# Patient Record
Sex: Female | Born: 1975 | Race: White | Hispanic: No | Marital: Single | Smoking: Current every day smoker
Health system: Southern US, Community
[De-identification: ages and names within clinical notes are randomized; demographics above are authoritative.]

## PROBLEM LIST (undated history)

## (undated) DIAGNOSIS — F419 Anxiety disorder, unspecified: Secondary | ICD-10-CM

## (undated) DIAGNOSIS — G5603 Carpal tunnel syndrome, bilateral upper limbs: Secondary | ICD-10-CM

## (undated) DIAGNOSIS — F439 Reaction to severe stress, unspecified: Secondary | ICD-10-CM

## (undated) DIAGNOSIS — F329 Major depressive disorder, single episode, unspecified: Secondary | ICD-10-CM

## (undated) DIAGNOSIS — F32A Depression, unspecified: Secondary | ICD-10-CM

## (undated) HISTORY — PX: FOOT SURGERY: SHX648

## (undated) HISTORY — PX: MOUTH SURGERY: SHX715

## (undated) HISTORY — PX: TUBAL LIGATION: SHX77

---
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
## 1998-02-21 ENCOUNTER — Inpatient Hospital Stay (HOSPITAL_COMMUNITY): Admission: AD | Admit: 1998-02-21 | Discharge: 1998-02-21 | Payer: Self-pay | Admitting: Obstetrics

## 1999-05-23 ENCOUNTER — Emergency Department (HOSPITAL_COMMUNITY): Admission: EM | Admit: 1999-05-23 | Discharge: 1999-05-23 | Payer: Self-pay | Admitting: Emergency Medicine

## 2001-11-20 ENCOUNTER — Emergency Department (HOSPITAL_COMMUNITY): Admission: EM | Admit: 2001-11-20 | Discharge: 2001-11-20 | Payer: Self-pay | Admitting: Emergency Medicine

## 2011-07-26 ENCOUNTER — Emergency Department (HOSPITAL_COMMUNITY)
Admission: EM | Admit: 2011-07-26 | Discharge: 2011-07-27 | Disposition: A | Discharge status: Home / Self Care | Payer: Self-pay | Attending: Emergency Medicine | Admitting: Emergency Medicine

## 2011-07-26 ENCOUNTER — Encounter: Payer: Self-pay | Admitting: *Deleted

## 2011-07-26 DIAGNOSIS — H9210 Otorrhea, unspecified ear: Secondary | ICD-10-CM | POA: Insufficient documentation

## 2011-07-26 DIAGNOSIS — J3489 Other specified disorders of nose and nasal sinuses: Secondary | ICD-10-CM | POA: Insufficient documentation

## 2011-07-26 DIAGNOSIS — H60399 Other infective otitis externa, unspecified ear: Secondary | ICD-10-CM | POA: Insufficient documentation

## 2011-07-26 DIAGNOSIS — F17200 Nicotine dependence, unspecified, uncomplicated: Secondary | ICD-10-CM | POA: Insufficient documentation

## 2011-07-26 DIAGNOSIS — M25519 Pain in unspecified shoulder: Secondary | ICD-10-CM | POA: Insufficient documentation

## 2011-07-26 DIAGNOSIS — R51 Headache: Secondary | ICD-10-CM | POA: Insufficient documentation

## 2011-07-26 DIAGNOSIS — H6092 Unspecified otitis externa, left ear: Secondary | ICD-10-CM

## 2011-07-26 DIAGNOSIS — J019 Acute sinusitis, unspecified: Secondary | ICD-10-CM | POA: Insufficient documentation

## 2011-07-26 DIAGNOSIS — R220 Localized swelling, mass and lump, head: Secondary | ICD-10-CM | POA: Insufficient documentation

## 2011-07-26 DIAGNOSIS — H9209 Otalgia, unspecified ear: Secondary | ICD-10-CM | POA: Insufficient documentation

## 2011-07-26 NOTE — ED Notes (Signed)
Patient c/o right shoulder pain and ibuprofen is not working.  Patient also thinks that her right ear drum has been blown due to drainage.

## 2011-07-27 MED ORDER — HYDROCODONE-ACETAMINOPHEN 7.5-325 MG/15ML PO SOLN: 15.0000 mL | ORAL | Status: AC | PRN

## 2011-07-27 MED ORDER — AMOXICILLIN 500 MG PO CAPS: 500.0000 mg | ORAL_CAPSULE | Freq: Three times a day (TID) | ORAL | Status: AC

## 2011-07-27 MED ORDER — CIPROFLOXACIN-DEXAMETHASONE 0.3-0.1 % OT SUSP: 4.0000 [drp] | Freq: Two times a day (BID) | OTIC | Status: AC

## 2011-07-27 MED ORDER — PREDNISONE 20 MG PO TABS: 40.0000 mg | ORAL_TABLET | Freq: Every day | ORAL | Status: DC

## 2011-07-27 MED ORDER — CIPROFLOXACIN-DEXAMETHASONE 0.3-0.1 % OT SUSP: 4.0000 [drp] | Freq: Two times a day (BID) | OTIC | Status: DC

## 2011-07-27 MED ORDER — AMOXICILLIN 500 MG PO CAPS: 500.0000 mg | ORAL_CAPSULE | Freq: Three times a day (TID) | ORAL | Status: DC

## 2011-07-27 MED ORDER — PREDNISONE 20 MG PO TABS: 40.0000 mg | ORAL_TABLET | Freq: Every day | ORAL | Status: AC

## 2011-07-27 MED ORDER — PREDNISONE 20 MG PO TABS
60.0000 mg | ORAL_TABLET | Freq: Once | ORAL | Status: AC
  Administered 2011-07-27: 60 mg via ORAL

## 2011-07-27 NOTE — ED Notes (Signed)
I gave the patient a cup of coffee and two packs of chocolate chip cookies.

## 2011-07-27 NOTE — ED Provider Notes (Signed)
Medical screening examination/treatment/procedure(s) were performed by non-physician practitioner and as supervising physician I was immediately available for consultation/collaboration.   Vida Roller, MD 07/27/11 [phone]

## 2011-07-27 NOTE — ED Provider Notes (Signed)
History     CSN: [id number] Arrival date & time: 07/26/2011 10:16 PM   First MD Initiated Contact with Patient 07/27/11 0154      Chief Complaint  Patient presents with  . Shoulder Pain     Patient is a 35 y.o. female presenting with shoulder pain. The history is provided by the patient. No language interpreter was used.  Shoulder Pain This is a recurrent problem. The current episode started more than 1 month ago. The problem occurs intermittently. The problem has been unchanged. Associated symptoms include congestion. Pertinent negatives include no fever. She has tried NSAIDs for the symptoms. The treatment provided mild relief.  Reports persistent, intermittent (R) shoulder pain x 2 mo after moving a heavy piece of furniture. Pt also c/o severe sinus congestion, frontal area h/a's and (L) ear pain x 2 weeks.    History reviewed. No pertinent past medical history.  History reviewed. No pertinent past surgical history.  No family history on file.  History  Substance Use Topics  . Smoking status: Current Everyday Smoker -- 1.0 packs/day    Types: Cigarettes  . Smokeless tobacco: Not on file  . Alcohol Use: No    OB History    Grav Para Term Preterm Abortions TAB SAB Ect Mult Living                  Review of Systems  Constitutional: Negative.  Negative for fever.  HENT: Positive for congestion and ear discharge.   Eyes: Negative.   Respiratory: Negative.   Cardiovascular: Negative.   Gastrointestinal: Negative.   Genitourinary: Negative.   Musculoskeletal:       Pain w/ ROM of (R) shoulder.  Skin: Negative.   Neurological: Negative.   Hematological: Negative.   Psychiatric/Behavioral: Negative.     Allergies  Penicillins and Tobradex st  Home Medications   Current Outpatient Rx  Name Route Sig Dispense Refill  . GUAIFENESIN 600 MG PO TB12 Oral Take 1,200 mg by mouth 2 (two) times daily. For congestion     . IBUPROFEN 600 MG PO TABS Oral Take 600 mg by  mouth every 6 (six) hours as needed. pain       BP 101/68  Pulse 72  Temp(Src) 98.6 F (37 C) (Oral)  Resp 16  SpO2 97%  Physical Exam  Constitutional: She is oriented to person, place, and time. She appears well-developed and well-nourished.  HENT:  Head: Normocephalic and atraumatic.  Right Ear: External ear and ear canal normal.  Left Ear: There is drainage. Tympanic membrane is erythematous.  Nose: Mucosal edema present. No nasal deformity.  Mouth/Throat: Uvula is midline, oropharynx is clear and moist and mucous membranes are normal.       (R) TM w/ scarring from hx chronic ear infections as child. (L) external ear canal erythematous w/ pus like exudate.  Eyes: Conjunctivae are normal.  Neck: Normal range of motion. Neck supple.  Cardiovascular: Normal rate.   Pulmonary/Chest: Effort normal and breath sounds normal.  Musculoskeletal:       Right shoulder: She exhibits pain. She exhibits normal range of motion, no swelling, no effusion and no deformity.  Neurological: She is alert and oriented to person, place, and time. She has normal reflexes.  Skin: Skin is warm and dry. No rash noted.  Psychiatric: She has a normal mood and affect.    ED Course  Procedures (including critical care time)  Labs Reviewed - No data to display No results found.  No diagnosis found.    MDM  PE findings c/w sinusitis and (L) otitis externa Full rom of (R) shoulder. No boney point TTP. Will rest w/ sling, provide sghort course of med for pain and encourage f/u w/ ortho if no improvement PCP referrals provided as well.        Leanne Chang, NP 07/27/11 [phone]

## 2011-10-01 ENCOUNTER — Emergency Department (HOSPITAL_COMMUNITY)
Admission: EM | Admit: 2011-10-01 | Discharge: 2011-10-01 | Disposition: A | Discharge status: Home / Self Care | Payer: Self-pay | Attending: Emergency Medicine | Admitting: Emergency Medicine

## 2011-10-01 ENCOUNTER — Encounter (HOSPITAL_COMMUNITY): Payer: Self-pay | Admitting: *Deleted

## 2011-10-01 DIAGNOSIS — H6120 Impacted cerumen, unspecified ear: Secondary | ICD-10-CM | POA: Insufficient documentation

## 2011-10-01 DIAGNOSIS — H6690 Otitis media, unspecified, unspecified ear: Secondary | ICD-10-CM | POA: Insufficient documentation

## 2011-10-01 DIAGNOSIS — F17200 Nicotine dependence, unspecified, uncomplicated: Secondary | ICD-10-CM | POA: Insufficient documentation

## 2011-10-01 DIAGNOSIS — H9209 Otalgia, unspecified ear: Secondary | ICD-10-CM | POA: Insufficient documentation

## 2011-10-01 DIAGNOSIS — H6692 Otitis media, unspecified, left ear: Secondary | ICD-10-CM

## 2011-10-01 MED ORDER — ANTIPYRINE-BENZOCAINE 5.4-1.4 % OT SOLN
3.0000 [drp] | Freq: Once | OTIC | Status: AC
  Administered 2011-10-01: 4 [drp] via OTIC
  Filled 2011-10-01: qty 10

## 2011-10-01 MED ORDER — PROMETHAZINE HCL 25 MG PO TABS: 25.0000 mg | ORAL_TABLET | Freq: Four times a day (QID) | ORAL | Status: AC | PRN

## 2011-10-01 MED ORDER — AMOXICILLIN 500 MG PO CAPS: 500.0000 mg | ORAL_CAPSULE | Freq: Three times a day (TID) | ORAL | Status: AC

## 2011-10-01 MED ORDER — ACETAMINOPHEN 500 MG PO TABS
1000.0000 mg | ORAL_TABLET | Freq: Once | ORAL | Status: AC
Start: 2011-10-01 — End: 2011-10-01
  Administered 2011-10-01: 1000 mg via ORAL
  Filled 2011-10-01: qty 2

## 2011-10-01 NOTE — ED Notes (Signed)
Pt alert, nad, c/o ear aches, onset several months ago, resp even unlabored, skin pwd, right TM not visible, left TM not visible, pt has gross amount of "slough like" tissue in canal. ALP bedside to eval pt

## 2011-10-01 NOTE — ED Notes (Signed)
Irrigated bilateral ears, copious amounts of brown fluid returns, TM visualized, ALP notified

## 2011-10-01 NOTE — ED Notes (Signed)
Pt in c/o bilateral earache and drainage, states this has been going on for months and has been evaluated for same but infections have not healed

## 2011-10-03 NOTE — ED Provider Notes (Signed)
History     CSN: [id number]  Arrival date & time 10/01/11  0049   First MD Initiated Contact with Patient 10/01/11 0101      Chief Complaint  Patient presents with  . Otalgia    (Consider location/radiation/quality/duration/timing/severity/associated sxs/prior treatment) Patient is a 36 y.o. female presenting with ear pain. The history is provided by the patient.  Otalgia This is a recurrent problem. The current episode started more than 2 days ago. There is pain in the left ear. The problem occurs constantly. The problem has been gradually worsening. There has been no fever. The pain is at a severity of 5/10. The pain is moderate. Associated symptoms include hearing loss. Pertinent negatives include no ear discharge, no rhinorrhea, no sore throat and no cough.    History reviewed. No pertinent past medical history.  History reviewed. No pertinent past surgical history.  History reviewed. No pertinent family history.  History  Substance Use Topics  . Smoking status: Current Everyday Smoker -- 1.0 packs/day    Types: Cigarettes  . Smokeless tobacco: Not on file  . Alcohol Use: No    OB History    Grav Para Term Preterm Abortions TAB SAB Ect Mult Living                  Review of Systems  Constitutional: Negative.   HENT: Positive for hearing loss and ear pain. Negative for sore throat, rhinorrhea and ear discharge.   Eyes: Negative.   Respiratory: Negative.  Negative for cough.   Cardiovascular: Negative.   Gastrointestinal: Negative.   Genitourinary: Negative.   Musculoskeletal: Negative.   Skin: Negative.   Neurological: Negative.   Hematological: Negative.   Psychiatric/Behavioral: Negative.   Pt reports several days of increasing (L) ear pain. States she has chronic OM L>R but is unable to afford to see ENT physician. States she has lots of intermittent ear itching and ear wax for which she frequently uses Q-tips to remove.   Allergies  Penicillins;  Shellfish allergy; and Tobradex st  Home Medications   Current Outpatient Rx  Name Route Sig Dispense Refill  . GUAIFENESIN ER 600 MG PO TB12 Oral Take 1,200 mg by mouth 2 (two) times daily. For congestion     . IBUPROFEN 200 MG PO TABS Oral Take 400 mg by mouth every 6 (six) hours as needed. Pain    . AMOXICILLIN 500 MG PO CAPS Oral Take 1 capsule (500 mg total) by mouth 3 (three) times daily. 30 capsule 0  . PROMETHAZINE HCL 25 MG PO TABS Oral Take 1 tablet (25 mg total) by mouth every 6 (six) hours as needed for nausea. 10 tablet 0    BP 132/69  Pulse 93  Temp(Src) 98 F (36.7 C) (Oral)  Resp 20  SpO2 100%  Physical Exam  Constitutional: She is oriented to person, place, and time. She appears well-developed and well-nourished.  HENT:  Head: Normocephalic and atraumatic.  Right Ear: Tympanic membrane normal.  Left Ear: Tympanic membrane is erythematous.       Cerumen impactions bil requiring bil irrigations to visualize TM's. Lots of cerumen and non-infectious appearing debris in bil ear canals. (L) TM erythematous.  Eyes: Conjunctivae are normal.  Neck: Neck supple.  Cardiovascular: Normal rate.   Pulmonary/Chest: Effort normal.  Musculoskeletal: Normal range of motion.  Neurological: She is alert and oriented to person, place, and time.  Skin: Skin is warm and dry. No erythema.  Psychiatric: She has a normal mood and  affect.    ED Course  Procedures Findings and clinical impression discussed w/ pt. Will plan for d/c home w/ tx for (L) OM.  Labs Reviewed - No data to display No results found.   1. Otitis media of left ear       MDM  HPI/PE and clinical findings c/w 1.Bil cerumen impactions 2. (L) OM        Leanne Chang, NP 10/03/11 1258  Medical screening examination/treatment/procedure(s) were performed by non-physician practitioner and as supervising physician I was immediately available for consultation/collaboration.  Sunnie Nielsen,  MD 10/04/11 [phone]

## 2012-02-27 ENCOUNTER — Emergency Department (HOSPITAL_COMMUNITY)
Admission: EM | Admit: 2012-02-27 | Discharge: 2012-02-28 | Disposition: A | Discharge status: Home / Self Care | Payer: Self-pay | Attending: Emergency Medicine | Admitting: Emergency Medicine

## 2012-02-27 ENCOUNTER — Encounter (HOSPITAL_COMMUNITY): Payer: Self-pay

## 2012-02-27 DIAGNOSIS — B369 Superficial mycosis, unspecified: Secondary | ICD-10-CM | POA: Insufficient documentation

## 2012-02-27 DIAGNOSIS — B368 Other specified superficial mycoses: Secondary | ICD-10-CM | POA: Insufficient documentation

## 2012-02-27 DIAGNOSIS — H6240 Otitis externa in other diseases classified elsewhere, unspecified ear: Secondary | ICD-10-CM

## 2012-02-27 DIAGNOSIS — F17200 Nicotine dependence, unspecified, uncomplicated: Secondary | ICD-10-CM | POA: Insufficient documentation

## 2012-02-27 LAB — CBC
Hemoglobin: 14 g/dL (ref 12.0–15.0)
MCH: 30.2 pg (ref 26.0–34.0)
MCHC: 34.7 g/dL (ref 30.0–36.0)
MCV: 87 fL (ref 78.0–100.0)
Platelets: 201 10*3/uL (ref 150–400)

## 2012-02-27 LAB — BASIC METABOLIC PANEL
Calcium: 9.1 mg/dL (ref 8.4–10.5)
Creatinine, Ser: 0.82 mg/dL (ref 0.50–1.10)
GFR calc non Af Amer: >90 mL/min (ref >90)
Glucose, Bld: 92 mg/dL (ref 70–99)
Sodium: 137 mEq/L (ref 135–145)

## 2012-02-27 LAB — GLUCOSE, CAPILLARY: Glucose-Capillary: 90 mg/dL (ref 70–99)

## 2012-02-27 MED ORDER — CLOTRIMAZOLE 1 % EX SOLN: Freq: Two times a day (BID) | CUTANEOUS | Status: AC

## 2012-02-27 NOTE — ED Notes (Signed)
Both ears are bothering pt.  States she can't afford to go the ENT.  Currently they are "clogged up and are itching and are draining.  They dry up and i scratch them"  Pt states this is an ongoing thing.

## 2012-02-27 NOTE — ED Provider Notes (Signed)
History     CSN: [id number]  Arrival date & time 02/27/12  7829   First MD Initiated Contact with Patient 02/27/12 2038      Chief Complaint  Patient presents with  . Ear Drainage  . Otalgia    (Consider location/radiation/quality/duration/timing/severity/associated sxs/prior treatment) HPI Comments: Patient presents emergency department with chief complaint of ear drainage and itching.  Patient states she's been suffering from this since September (6 months).  Patient states she's unable to afford an ENT or pass medications that have been prescribed to her.  She reports decreased hearing bilaterally and some mild otalgia. She states that it is draining and itching that bothers her.  Patient denies being a diabetic, immunocompromised or taking recent antibiotics. Associated s/s include right jaw pain.  Patient is a current everyday smoker.  No other complaints at this time.    Patient is a 36 y.o. female presenting with ear drainage and ear pain. The history is provided by the patient.  Ear Drainage Pertinent negatives include no congestion or neck pain.  Otalgia Associated symptoms include ear discharge and hearing loss. Pertinent negatives include no rhinorrhea and no neck pain.    History reviewed. No pertinent past medical history.  Past Surgical History  Procedure Date  . Foot surgery     RT foot-"2 titanium screws in my arch"  . Cesarean section     x2  . Mouth surgery   . Tubal ligation     History reviewed. No pertinent family history.  History  Substance Use Topics  . Smoking status: Current Everyday Smoker -- 1.0 packs/day    Types: Cigarettes  . Smokeless tobacco: Not on file  . Alcohol Use: No    OB History    Grav Para Term Preterm Abortions TAB SAB Ect Mult Living                  Review of Systems  HENT: Positive for hearing loss and ear discharge. Negative for ear pain, nosebleeds, congestion, facial swelling, rhinorrhea, neck pain, neck stiffness  and tinnitus.   All other systems reviewed and are negative.    Allergies  Shellfish allergy; Mucinex; Penicillins; and Tobramycin-dexamethasone  Home Medications   Current Outpatient Rx  Name Route Sig Dispense Refill  . CETIRIZINE HCL 10 MG PO TABS Oral Take 10 mg by mouth daily.    . IBUPROFEN 200 MG PO TABS Oral Take 400 mg by mouth every 6 (six) hours as needed. Pain and headache    . PSEUDOEPHEDRINE HCL 30 MG PO TABS Oral Take 30 mg by mouth every 4 (four) hours as needed. Congestion    . CLOTRIMAZOLE 1 % EX SOLN Topical Apply topically 2 (two) times daily. 30 mL 3    BP 111/69  Pulse 81  Temp 98.5 F (36.9 C) (Oral)  Resp 18  Ht 5\' 7"  (1.702 m)  Wt 224 lb (101.606 kg)  BMI 35.08 kg/m2  SpO2 100%  LMP 02/27/2012  Physical Exam  Nursing note and vitals reviewed. Constitutional: She is oriented to person, place, and time. She appears well-developed and well-nourished. No distress.  HENT:  Head: Normocephalic and atraumatic.       Decreased hearing bilaterally, unable to visualize tympanic membranes due to white sebaceous-like material in ears bilaterally.  No mastoid tenderness or tragal tenderness to palpation.    Eyes: Conjunctivae and EOM are normal.  Neck: Normal range of motion.  Pulmonary/Chest: Effort normal.  Musculoskeletal: Normal range of motion.  Neurological:  She is alert and oriented to person, place, and time.  Skin: Skin is warm and dry. No rash noted. She is not diaphoretic.  Psychiatric: She has a normal mood and affect. Her behavior is normal.    ED Course  Procedures (including critical care time)   Labs Reviewed  GLUCOSE, CAPILLARY  SEDIMENTATION RATE  CBC  BASIC METABOLIC PANEL   No results found.   1. 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  Treatment per up to date as follows "1% solution, applied twice  daily for 10 to 14 days, and then reassess the ear canal. If fungal elements are identified, the ear canal should again be meticulously cleaned and undergo a further 10 to 14 day course of topical antifungal with reassessment thereafter. Persistent otomycosis should be managed by an otolaryngologist to ensure optimal cleaning of the external canal (usually with microscopic otoscopy). Ear cleaning followed by topical therapy and reassessment in two weeks may be required for several cycles to achieve eradication" Discussed with pt who verbalizes understanding.          Jaci Carrel, [place] 02/28/12 0005

## 2012-02-27 NOTE — Discharge Instructions (Signed)
Use medication as prescribed, applied twice daily for 10 to 14 days, and then follow up to have ear canal reassessed. If fungal elements are identified, the ear canal should again be meticulously cleaned and undergo a further 10 to 14 day course of topical antifungal with reassessment thereafter. Persistent otomycosis should be managed by an otolaryngologist to ensure optimal cleaning of the external canal (usually with microscopic otoscopy). Ear cleaning followed by topical therapy and reassessment in two weeks may be required for several cycles to achieve eradication" Discussed with pt who verbalizes understanding.   Otomycosis Otomycosis is a fungus infection of the ear canal. This may cause an earache, hearing loss, and large amounts of debris to accumulate in the ear canal. Otomycosis is more common in humid climates and is usually caused by candida or aspegillus. External ear infections that do not respond to antibiotic ear drops may be due to a fungus. The most common symptom or complaint is itching or itching deep in the ear canal. Unless your eardrum has a hole in it, anti-fungal medicines used on the skin can be used 2 to 3 times daily in the ear canal after the debris is cleaned out thoroughly. Two weeks of treatment are usually needed, but careful follow-up is recommended. Fungus infections can be difficult to cure. Except for your ear drops, the ear canal must be kept dry until the infection is cured. Do not go swimming or use ear plugs. Protect your ear canal with a cotton ball covered with petroleum jelly while showering. Call your caregiver if you are not better after several days of treatment.  SEEK IMMEDIATE MEDICAL CARE IF: You develop severe pain, increased hearing loss, dizziness, vomiting, a high fever, or other serious symptoms. Document Released: 10/09/2004 Document Revised: 08/21/2011 Document Reviewed: 12/27/2008 [facility] Patient Information 2012 [place], [place].

## 2012-02-28 NOTE — ED Provider Notes (Signed)
Medical screening examination/treatment/procedure(s) were performed by non-physician practitioner and as supervising physician I was immediately available for consultation/collaboration.   Nat Christen, MD 02/28/12 [phone]

## 2013-03-08 ENCOUNTER — Encounter (HOSPITAL_COMMUNITY): Payer: Self-pay | Admitting: Family Medicine

## 2013-03-08 ENCOUNTER — Emergency Department (HOSPITAL_COMMUNITY)
Admission: EM | Admit: 2013-03-08 | Discharge: 2013-03-09 | Disposition: A | Discharge status: Home / Self Care | Payer: BC Managed Care – PPO | Attending: Emergency Medicine | Admitting: Emergency Medicine

## 2013-03-08 DIAGNOSIS — H9210 Otorrhea, unspecified ear: Secondary | ICD-10-CM | POA: Insufficient documentation

## 2013-03-08 DIAGNOSIS — Z79899 Other long term (current) drug therapy: Secondary | ICD-10-CM | POA: Insufficient documentation

## 2013-03-08 DIAGNOSIS — G8929 Other chronic pain: Secondary | ICD-10-CM | POA: Insufficient documentation

## 2013-03-08 DIAGNOSIS — F17200 Nicotine dependence, unspecified, uncomplicated: Secondary | ICD-10-CM | POA: Insufficient documentation

## 2013-03-08 DIAGNOSIS — H6092 Unspecified otitis externa, left ear: Secondary | ICD-10-CM

## 2013-03-08 DIAGNOSIS — H9209 Otalgia, unspecified ear: Secondary | ICD-10-CM | POA: Insufficient documentation

## 2013-03-08 DIAGNOSIS — Z880 Allergy status to penicillin: Secondary | ICD-10-CM | POA: Insufficient documentation

## 2013-03-08 DIAGNOSIS — R51 Headache: Secondary | ICD-10-CM | POA: Insufficient documentation

## 2013-03-08 DIAGNOSIS — H60399 Other infective otitis externa, unspecified ear: Secondary | ICD-10-CM | POA: Insufficient documentation

## 2013-03-08 MED ORDER — KETOROLAC TROMETHAMINE 60 MG/2ML IM SOLN
60.0000 mg | Freq: Once | INTRAMUSCULAR | Status: AC
  Administered 2013-03-09: 60 mg via INTRAMUSCULAR
  Filled 2013-03-08: qty 2

## 2013-03-08 MED ORDER — CIPROFLOXACIN-DEXAMETHASONE 0.3-0.1 % OT SUSP
4.0000 [drp] | Freq: Once | OTIC | Status: AC
  Administered 2013-03-09: 4 [drp] via OTIC
  Filled 2013-03-08: qty 7.5

## 2013-03-08 NOTE — ED Notes (Signed)
Unable to obtain temp due to pt drinking iced coffee

## 2013-03-08 NOTE — ED Notes (Addendum)
Patient states that she has been having headaches off and on since the winter. States headaches are getting worse and is having dizziness. Occasional nausea. Has been taking Aleve with minimal relief. Also c/o left ear pressure and states ear is draining and has a smell to it.

## 2013-03-08 NOTE — ED Notes (Signed)
Pt reports increasing amounts of stressors in her daily life.  Pt reports being the sole caregiver of children.  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

## 2013-03-08 NOTE — ED Provider Notes (Signed)
History    CSN: [id number] Arrival date & time 03/08/13  2229  First MD Initiated Contact with Patient 03/08/13 2334     Chief Complaint  Patient presents with  . Headache   (Consider location/radiation/quality/duration/timing/severity/associated sxs/prior Treatment) HPI Hx per patient - L sided HA everyday for the last 6 months, recently divorced, admits to multiple stressors. Has recently been referred to a neurologist.  She has no weakness, numbness, trouble with speech/ gait.  No N/V. No CP or SOB. No rash, no fever or neck stiffness.  HA dull in quality, not radiating, not worse HA of life.   She also has L ear drainge and discomfort.  She has ear drops at home for pain relief and feels like she needs ABx.   No sore throat, cough or swelling. No recent swimming/ aquatics.  History reviewed. No pertinent past medical history. Past Surgical History  Procedure Laterality Date  . Foot surgery      RT foot-"2 titanium screws in my arch"  . Cesarean section      x2  . Mouth surgery    . Tubal ligation     No family history on file. History  Substance Use Topics  . Smoking status: Current Every Day Smoker -- 0.50 packs/day    Types: Cigarettes  . Smokeless tobacco: Not on file  . Alcohol Use: No   OB History   Grav Para Term Preterm Abortions TAB SAB Ect Mult Living                 Review of Systems  Constitutional: Negative for fever and chills.  HENT: Positive for ear pain and ear discharge. Negative for hearing loss, neck pain and neck stiffness.   Eyes: Negative for redness and visual disturbance.  Respiratory: Negative for shortness of breath.   Cardiovascular: Negative for chest pain.  Gastrointestinal: Negative for abdominal pain.  Genitourinary: Negative for dysuria.  Musculoskeletal: Negative for back pain.  Skin: Negative for rash.  Neurological: Positive for headaches.  All other systems reviewed and are negative.    Allergies  Shellfish allergy;  Mucinex; Penicillins; and Tobramycin-dexamethasone  Home Medications   Current Outpatient Rx  Name  Route  Sig  Dispense  Refill  . cetirizine (ZYRTEC) 10 MG tablet   Oral   Take 10 mg by mouth every evening.          . naproxen sodium (ANAPROX) 220 MG tablet   Oral   Take 220 mg by mouth 2 (two) times daily as needed (pain).          BP 118/71  Pulse 81  Temp(Src) 97.5 F (36.4 C) (Oral)  Resp 18  Ht 5\' 7"  (1.702 m)  Wt 235 lb (106.595 kg)  BMI 36.8 kg/m2  SpO2 97%  LMP 03/03/2013 Physical Exam  Constitutional: She is oriented to person, place, and time. She appears well-developed and well-nourished.  HENT:  Head: Normocephalic and atraumatic.  Nose: Nose normal.  Mouth/Throat: Oropharynx is clear and moist. No oropharyngeal exudate.  TMs clear, L ear canal drainage and mild tenderness ext ear  Eyes: EOM are normal. Pupils are equal, round, and reactive to light. Right eye exhibits no discharge. Left eye exhibits no discharge.  Neck: Normal range of motion. Neck supple.  Cardiovascular: Normal rate, regular rhythm and intact distal pulses.   Pulmonary/Chest: Effort normal and breath sounds normal. No respiratory distress.  Abdominal: Soft. She exhibits no distension. There is no tenderness.  Musculoskeletal: Normal range  of motion. She exhibits no edema.  Neurological: She is alert and oriented to person, place, and time.  Skin: Skin is warm and dry.    ED Course  Procedures (including critical care time)  IM toradol and ABx ear drops provided  NEU referral provided. HA precautions provided.   Plan d/c home treatment for otitis externa and motrin 800mg  for headaches  MDM  Chronic HA and L OE. Normal neuro exam - no indication for imaging or LP at this time.   Medications provided  VS and nursing notes reviewed/ considered  Sunnie Nielsen, MD 03/09/13 0001

## 2013-03-09 MED ORDER — IBUPROFEN 800 MG PO TABS: 800.0000 mg | ORAL_TABLET | Freq: Three times a day (TID) | ORAL | Status: DC

## 2013-03-09 MED ORDER — CIPROFLOXACIN-DEXAMETHASONE 0.3-0.1 % OT SUSP: 4.0000 [drp] | Freq: Two times a day (BID) | OTIC | Status: DC

## 2013-03-31 ENCOUNTER — Encounter (HOSPITAL_COMMUNITY): Payer: Self-pay | Admitting: *Deleted

## 2013-03-31 ENCOUNTER — Emergency Department (HOSPITAL_COMMUNITY)
Admission: EM | Admit: 2013-03-31 | Discharge: 2013-03-31 | Disposition: A | Discharge status: Home / Self Care | Payer: BC Managed Care – PPO | Attending: Emergency Medicine | Admitting: Emergency Medicine

## 2013-03-31 DIAGNOSIS — H53149 Visual discomfort, unspecified: Secondary | ICD-10-CM | POA: Insufficient documentation

## 2013-03-31 DIAGNOSIS — F17200 Nicotine dependence, unspecified, uncomplicated: Secondary | ICD-10-CM | POA: Insufficient documentation

## 2013-03-31 DIAGNOSIS — J3489 Other specified disorders of nose and nasal sinuses: Secondary | ICD-10-CM | POA: Insufficient documentation

## 2013-03-31 DIAGNOSIS — H109 Unspecified conjunctivitis: Secondary | ICD-10-CM | POA: Insufficient documentation

## 2013-03-31 DIAGNOSIS — J329 Chronic sinusitis, unspecified: Secondary | ICD-10-CM | POA: Insufficient documentation

## 2013-03-31 DIAGNOSIS — H6092 Unspecified otitis externa, left ear: Secondary | ICD-10-CM

## 2013-03-31 DIAGNOSIS — Z880 Allergy status to penicillin: Secondary | ICD-10-CM | POA: Insufficient documentation

## 2013-03-31 DIAGNOSIS — H60399 Other infective otitis externa, unspecified ear: Secondary | ICD-10-CM | POA: Insufficient documentation

## 2013-03-31 DIAGNOSIS — IMO0002 Reserved for concepts with insufficient information to code with codable children: Secondary | ICD-10-CM | POA: Insufficient documentation

## 2013-03-31 MED ORDER — CIPROFLOXACIN HCL 0.3 % OP OINT: TOPICAL_OINTMENT | OPHTHALMIC | Status: DC

## 2013-03-31 MED ORDER — AZITHROMYCIN 250 MG PO TABS: ORAL_TABLET | ORAL | Status: DC

## 2013-03-31 NOTE — ED Notes (Signed)
Pt reports that she has instilled "expired" ABT eye drops in both eyes (was her son's unused eye drops), states "eyes become worse".

## 2013-03-31 NOTE — ED Notes (Signed)
Pt c/o bilateral eye pain; matting; redness

## 2013-03-31 NOTE — ED Provider Notes (Signed)
History    CSN: [id number] Arrival date & time 03/31/13  0504  First MD Initiated Contact with Patient 03/31/13 [phone]     Chief Complaint  Patient presents with  . Eye Pain   HPI  History provided by the patient. The patient is a 37 year old female with history of seasonal allergies presenting with complaints of worsened sinus congestion and bilateral eye redness and drainage. Patient states she's had congestion symptoms for the past one month. She has also been dealing with pain and swelling of her left ear with drainage. She was seen previously in the emergency department for this and has been using antibiotic ear drops for her ear. She has noticed some improvements but over the past several days she has had redness and drainage from her eyes. She is concerned she may have been rubbing her eyes due to her allergies and spreading drainage from her ear to that area. She has also continued to have worsened sinus pressure in her maxillary and frontal sinuses. She denies any other aggravating or alleviating factors. She denies any other associated symptoms. No fever, chills or sweats. No blurred or loss of vision. No double vision.    History reviewed. No pertinent past medical history. Past Surgical History  Procedure Laterality Date  . Foot surgery      RT foot-"2 titanium screws in my arch"  . Cesarean section      x2  . Mouth surgery    . Tubal ligation     No family history on file. History  Substance Use Topics  . Smoking status: Current Every Day Smoker -- 0.50 packs/day    Types: Cigarettes  . Smokeless tobacco: Not on file  . Alcohol Use: No   OB History   Grav Para Term Preterm Abortions TAB SAB Ect Mult Living                 Review of Systems  Constitutional: Negative for fever, chills and diaphoresis.  HENT: Positive for ear pain, congestion, sinus pressure and ear discharge. Negative for sore throat.   Eyes: Positive for photophobia, pain, discharge, redness and  itching. Negative for visual disturbance.  Respiratory: Negative for cough.   Gastrointestinal: Negative for vomiting and diarrhea.  Skin: Negative for rash.  All other systems reviewed and are negative.    Allergies  Shellfish allergy; Mucinex; Penicillins; and Tobramycin-dexamethasone  Home Medications   Current Outpatient Rx  Name  Route  Sig  Dispense  Refill  . cetirizine (ZYRTEC) 10 MG tablet   Oral   Take 10 mg by mouth every evening.          . ciprofloxacin-dexamethasone (CIPRODEX) otic suspension   Left Ear   Place 4 drops into the left ear 2 (two) times daily.   7.5 mL   0   . ibuprofen (ADVIL,MOTRIN) 800 MG tablet   Oral   Take 1 tablet (800 mg total) by mouth 3 (three) times daily.   21 tablet   0   . naproxen sodium (ANAPROX) 220 MG tablet   Oral   Take 220 mg by mouth 2 (two) times daily as needed (pain).          BP 120/63  Pulse 76  Temp(Src) 98.5 F (36.9 C) (Oral)  Resp 20  SpO2 99%  LMP 03/03/2013 Physical Exam  Nursing note and vitals reviewed. Constitutional: She is oriented to person, place, and time. She appears well-developed and well-nourished. No distress.  HENT:  Head:  Normocephalic.  Erythema and mild swelling of the left auditory canal and at her ear. No significant drainage at this time. No swelling or redness over the mastoid process. No tenderness.  Eyes: EOM are normal. Pupils are equal, round, and reactive to light. Right eye exhibits exudate. Left eye exhibits exudate. Right conjunctiva is injected. Left conjunctiva is injected.  Cardiovascular: Normal rate and regular rhythm.   Pulmonary/Chest: Effort normal and breath sounds normal.  Abdominal: Soft.  Neurological: She is alert and oriented to person, place, and time.  Skin: Skin is warm and dry. No rash noted.  Psychiatric: She has a normal mood and affect. Her behavior is normal.    ED Course  Procedures    1. Conjunctivitis of both eyes   2. Otitis externa of  left ear   3. Sinusitis       MDM  5:20AM patient seen and evaluated. Patient appears well in no acute distress.  Angus Seller, PA-C 03/31/13 [phone]

## 2013-04-04 NOTE — ED Provider Notes (Signed)
Medical screening examination/treatment/procedure(s) were performed by non-physician practitioner and as supervising physician I was immediately available for consultation/collaboration.   Destaney Sarkis M Tifani Dack, MD 04/04/13 1611 

## 2013-06-07 ENCOUNTER — Emergency Department (HOSPITAL_COMMUNITY): Payer: BC Managed Care – PPO

## 2013-06-07 ENCOUNTER — Emergency Department (HOSPITAL_COMMUNITY)
Admission: EM | Admit: 2013-06-07 | Discharge: 2013-06-07 | Disposition: A | Discharge status: Home / Self Care | Payer: BC Managed Care – PPO | Attending: Emergency Medicine | Admitting: Emergency Medicine

## 2013-06-07 ENCOUNTER — Encounter (HOSPITAL_COMMUNITY): Payer: Self-pay | Admitting: *Deleted

## 2013-06-07 DIAGNOSIS — M79609 Pain in unspecified limb: Secondary | ICD-10-CM | POA: Insufficient documentation

## 2013-06-07 DIAGNOSIS — Z79899 Other long term (current) drug therapy: Secondary | ICD-10-CM | POA: Insufficient documentation

## 2013-06-07 DIAGNOSIS — F17200 Nicotine dependence, unspecified, uncomplicated: Secondary | ICD-10-CM | POA: Insufficient documentation

## 2013-06-07 DIAGNOSIS — M79644 Pain in right finger(s): Secondary | ICD-10-CM

## 2013-06-07 DIAGNOSIS — G8911 Acute pain due to trauma: Secondary | ICD-10-CM | POA: Insufficient documentation

## 2013-06-07 DIAGNOSIS — Z880 Allergy status to penicillin: Secondary | ICD-10-CM | POA: Insufficient documentation

## 2013-06-07 MED ORDER — NAPROXEN 500 MG PO TABS: 500.0000 mg | ORAL_TABLET | Freq: Two times a day (BID) | ORAL | Status: DC

## 2013-06-07 NOTE — ED Provider Notes (Signed)
CSN: [id number]     Arrival date & time 06/07/13  2013 History  This chart was scribed for non-physician practitioner Earley Favor, NP working with Celene Kras, MD by Danella Maiers, ED Scribe. This patient was seen in room WTR5/WTR5 and the patient's care was started at 8:45 PM.    Chief Complaint  Patient presents with  . Finger Injury   The history is provided by the patient. No language interpreter was used.   HPI Comments: Shannon Parrish is a 37 y.o. female who presents to the Emergency Department complaining of aching, worsening, right thumb pain onset three weeks ago after using a staple gun. She has tried an immobilizer brace but states it only made it worse. She has a calcium deficiency. She has a family history of arthritis. She is concerned about a hairline fracture and wants an x-ray.    History reviewed. No pertinent past medical history. Past Surgical History  Procedure Laterality Date  . Foot surgery      RT foot-"2 titanium screws in my arch"  . Cesarean section      x2  . Mouth surgery    . Tubal ligation     No family history on file. History  Substance Use Topics  . Smoking status: Current Every Day Smoker -- 1.00 packs/day    Types: Cigarettes  . Smokeless tobacco: Not on file  . Alcohol Use: No   OB History   Grav Para Term Preterm Abortions TAB SAB Ect Mult Living                 Review of Systems  Musculoskeletal: Positive for myalgias (right thumb). Negative for joint swelling.  Skin: Negative for wound.  All other systems reviewed and are negative.    Allergies  Shellfish allergy; Mucinex; Penicillins; and Tobramycin-dexamethasone  Home Medications   Current Outpatient Rx  Name  Route  Sig  Dispense  Refill  . cetirizine (ZYRTEC) 10 MG tablet   Oral   Take 10 mg by mouth every evening.          . ciprofloxacin-dexamethasone (CIPRODEX) otic suspension   Left Ear   Place 4 drops into the left ear daily as needed (for ear pain (whenever  infection flares up)).         Marland Kitchen ibuprofen (ADVIL,MOTRIN) 200 MG tablet   Oral   Take 800 mg by mouth every 8 (eight) hours as needed for pain.         . naproxen (NAPROSYN) 500 MG tablet   Oral   Take 1 tablet (500 mg total) by mouth 2 (two) times daily with a meal.   30 tablet   0    BP 122/77  Pulse 78  Temp(Src) 98.3 F (36.8 C)  Resp 16  LMP 05/22/2013 Physical Exam  Nursing note and vitals reviewed. Constitutional: She is oriented to person, place, and time. She appears well-developed and well-nourished. No distress.  HENT:  Head: Normocephalic and atraumatic.  Eyes: Conjunctivae and EOM are normal. Pupils are equal, round, and reactive to light.  Neck: Neck supple. No tracheal deviation present.  Cardiovascular: Normal rate, regular rhythm and normal heart sounds.   Pulmonary/Chest: Effort normal and breath sounds normal. No respiratory distress.  Musculoskeletal: Normal range of motion.  Pain with flexion.   Neurological: She is alert and oriented to person, place, and time.  Skin: Skin is warm and dry.  Psychiatric: She has a normal mood and affect. Her behavior is  normal.    ED Course  Procedures (including critical care time) Medications - No data to display  COORDINATION OF CARE: 8:44 PM- Discussed treatment plan with pt which includes thumb xray and pt agrees to plan.    Labs Review Labs Reviewed - No data to display Imaging Review Dg Finger Thumb Right  06/07/2013   CLINICAL DATA:  Three week history of right thumb pain. No known injuries.  EXAM: RIGHT THUMB 2+V  COMPARISON:  None.  FINDINGS: No evidence of acute or subacute fracture or dislocation. Well preserved joint spaces. Well preserved bone mineral density. Sesamoid bone at the IP joint mimics an old avulsion injury.  IMPRESSION: No acute, subacute or significant osseous abnormality.   Electronically Signed   By: Hulan Saas   On: 06/07/2013 21:43    MDM   1. Thumb pain, right      Extra reviewed.  There is no fracture seen.  No swelling seen.  Recommended that the patient.  Take Naprosyn on a regular basis, continue to wear him spica.  If this does not relieve her, discomfort.  She's been referred to Dr. Izora Ribas, hand specialist I personally performed the services described in this documentation, which was scribed in my presence. The recorded information has been reviewed and is accurate.  Arman Filter, NP 06/07/13 2203

## 2013-06-07 NOTE — ED Notes (Signed)
Pt reports using a staple gun and pushed with her right thumb three weeks ago. Pt has been taking ibuprofen, ice and using thumb immobilizer without relief

## 2013-06-08 NOTE — ED Provider Notes (Signed)
Medical screening examination/treatment/procedure(s) were performed by non-physician practitioner and as supervising physician I was immediately available for consultation/collaboration.    Celene Kras, MD 06/08/13 1537

## 2013-09-16 ENCOUNTER — Emergency Department (HOSPITAL_COMMUNITY)
Admission: EM | Admit: 2013-09-16 | Discharge: 2013-09-16 | Disposition: A | Discharge status: Home / Self Care | Payer: BC Managed Care – PPO | Attending: Emergency Medicine | Admitting: Emergency Medicine

## 2013-09-16 ENCOUNTER — Encounter (HOSPITAL_COMMUNITY): Payer: Self-pay | Admitting: Emergency Medicine

## 2013-09-16 DIAGNOSIS — L851 Acquired keratosis [keratoderma] palmaris et plantaris: Secondary | ICD-10-CM | POA: Insufficient documentation

## 2013-09-16 DIAGNOSIS — H6640 Suppurative otitis media, unspecified, unspecified ear: Secondary | ICD-10-CM | POA: Insufficient documentation

## 2013-09-16 DIAGNOSIS — F17200 Nicotine dependence, unspecified, uncomplicated: Secondary | ICD-10-CM | POA: Insufficient documentation

## 2013-09-16 DIAGNOSIS — J3489 Other specified disorders of nose and nasal sinuses: Secondary | ICD-10-CM | POA: Insufficient documentation

## 2013-09-16 DIAGNOSIS — G8929 Other chronic pain: Secondary | ICD-10-CM | POA: Insufficient documentation

## 2013-09-16 DIAGNOSIS — Z880 Allergy status to penicillin: Secondary | ICD-10-CM | POA: Insufficient documentation

## 2013-09-16 DIAGNOSIS — H9190 Unspecified hearing loss, unspecified ear: Secondary | ICD-10-CM | POA: Insufficient documentation

## 2013-09-16 MED ORDER — CIPROFLOXACIN-DEXAMETHASONE 0.3-0.1 % OT SUSP: 4.0000 [drp] | Freq: Two times a day (BID) | OTIC | Status: DC

## 2013-09-16 MED ORDER — AMOXICILLIN-POT CLAVULANATE 875-125 MG PO TABS: 1.0000 | ORAL_TABLET | Freq: Two times a day (BID) | ORAL | Status: DC

## 2013-09-16 NOTE — Discharge Instructions (Signed)
Take Augmentin as directed until gone. Use Ciprodex as directed until symptoms resolve. Refer to attached documents for more information. Follow up with your doctor.

## 2013-09-16 NOTE — ED Provider Notes (Signed)
CSN: [id number]     Arrival date & time 09/16/13  2136 History  This chart was scribed for non-physician practitioner Armanda Magic working with Rolland Porter, MD by Joaquin Music, ED Scribe. This patient was seen in room WTR5/WTR5 and the patient's care was started at 10:56 PM .   Chief Complaint  Patient presents with  . Otalgia   The history is provided by the patient. No language interpreter was used.   HPI Comments: Shannon Parrish is a 38 y.o. female who presents to the Emergency Department complaining of chronic bilateral otalgia with drainage and associated sinus congestion that began 2 weeks ago . Pt suspects she may have a sinus infection due to sinus pressure. She states she is unable to hear from her bilateral ears. She states she has dry skin around the exterior of her ear and suspects this is due to the drainage. Pt states she was diagnosed with same symptoms in a similar episode. She states she was taking ciprodex & Zyrtec with relief but states she has ran out of these medications. Pt states she has been taking OTC Ibuprofen with relief. She states she is on Zyprexa and Risperdal at this moment.  Pt denies hx of DM.  History reviewed. No pertinent past medical history. Past Surgical History  Procedure Laterality Date  . Foot surgery      RT foot-"2 titanium screws in my arch"  . Cesarean section      x2  . Mouth surgery    . Tubal ligation     No family history on file. History  Substance Use Topics  . Smoking status: Current Every Day Smoker -- 1.00 packs/day    Types: Cigarettes  . Smokeless tobacco: Not on file  . Alcohol Use: No   OB History   Grav Para Term Preterm Abortions TAB SAB Ect Mult Living                 Review of Systems  HENT: Positive for congestion, ear discharge, ear pain, hearing loss and sinus pressure.   Skin: Positive for color change.  All other systems reviewed and are negative.    Allergies  Shellfish allergy;  Mucinex; Penicillins; and Tobramycin-dexamethasone  Home Medications   Current Outpatient Rx  Name  Route  Sig  Dispense  Refill  . cetirizine (ZYRTEC) 10 MG tablet   Oral   Take 10 mg by mouth every evening.          . ciprofloxacin-dexamethasone (CIPRODEX) otic suspension   Left Ear   Place 4 drops into the left ear as needed (ear pain.).         Marland Kitchen ibuprofen (ADVIL,MOTRIN) 200 MG tablet   Oral   Take 800 mg by mouth every 8 (eight) hours as needed for pain.         Marland Kitchen OLANZapine (ZYPREXA) 5 MG tablet   Oral   Take 2.5 mg by mouth at bedtime.          BP 116/51  Pulse 99  Temp(Src) 98.2 F (36.8 C) (Oral)  Resp 20  Wt 240 lb (108.863 kg)  SpO2 98%  LMP 09/08/2013  Physical Exam  Nursing note and vitals reviewed. Constitutional: She is oriented to person, place, and time. She appears well-developed and well-nourished. No distress.  HENT:  Head: Normocephalic and atraumatic.  Right Ear: No lacerations. There is drainage and tenderness. No foreign bodies. Tympanic membrane is erythematous and bulging. Decreased hearing is noted.  Left  Ear: No lacerations. There is drainage and tenderness. No foreign bodies. Tympanic membrane is erythematous and bulging. Decreased hearing is noted.  Copious white discharge in bilateral ears. Extrenal ear canal erythematous and edematous. Edematous and cracked skin of bilateral ear lobes. Frontal sinus tenderness to palpitation.  Eyes: EOM are normal.  Neck: Neck supple. No tracheal deviation present.  Cardiovascular: Normal rate.   Pulmonary/Chest: Effort normal. No respiratory distress.  Musculoskeletal: Normal range of motion.  Neurological: She is alert and oriented to person, place, and time.  Skin: Skin is warm and dry.  Psychiatric: She has a normal mood and affect. Her behavior is normal.    ED Course  Procedures DIAGNOSTIC STUDIES: Oxygen Saturation is 98% on RA, normal by my interpretation.    COORDINATION OF  CARE: 11:08 PM-Discussed treatment plan which includes D/C pt with abx.  Pt agreed to plan.   Labs Review Labs Reviewed - No data to display Imaging Review No results found.  EKG Interpretation   None      MDM   1. Suppurative otitis media, bilateral    Patient has bilateral suppurative otitis media. Patient will have augmentin PO and Ciprodex. Patient advised to follow up with her PCP.   I personally performed the services described in this documentation, which was scribed in my presence. The recorded information has been reviewed and is accurate.    Emilia BeckKaitlyn Yanni Quiroa, PA-C 09/17/13 0410

## 2013-09-16 NOTE — ED Notes (Signed)
Pt states has chronic ear infection with draining; c/o sinus infection also; c/o double ear infection; ran out of ciprodex that was helping

## 2013-09-17 NOTE — ED Provider Notes (Signed)
Medical screening examination/treatment/procedure(s) were performed by non-physician practitioner and as supervising physician I was immediately available for consultation/collaboration.  EKG Interpretation   None         Jonmarc Bodkin M Versie Fleener, MD 09/17/13 0502 

## 2013-11-09 ENCOUNTER — Encounter (HOSPITAL_COMMUNITY): Payer: Self-pay | Admitting: Emergency Medicine

## 2013-11-09 ENCOUNTER — Emergency Department (HOSPITAL_COMMUNITY)
Admission: EM | Admit: 2013-11-09 | Discharge: 2013-11-10 | Disposition: A | Discharge status: Home / Self Care | Payer: BC Managed Care – PPO | Attending: Emergency Medicine | Admitting: Emergency Medicine

## 2013-11-09 DIAGNOSIS — Z79899 Other long term (current) drug therapy: Secondary | ICD-10-CM | POA: Insufficient documentation

## 2013-11-09 DIAGNOSIS — F17200 Nicotine dependence, unspecified, uncomplicated: Secondary | ICD-10-CM | POA: Insufficient documentation

## 2013-11-09 DIAGNOSIS — R110 Nausea: Secondary | ICD-10-CM | POA: Insufficient documentation

## 2013-11-09 DIAGNOSIS — Z880 Allergy status to penicillin: Secondary | ICD-10-CM | POA: Insufficient documentation

## 2013-11-09 DIAGNOSIS — Z9851 Tubal ligation status: Secondary | ICD-10-CM | POA: Insufficient documentation

## 2013-11-09 DIAGNOSIS — Z792 Long term (current) use of antibiotics: Secondary | ICD-10-CM | POA: Insufficient documentation

## 2013-11-09 DIAGNOSIS — N390 Urinary tract infection, site not specified: Secondary | ICD-10-CM | POA: Insufficient documentation

## 2013-11-09 DIAGNOSIS — R197 Diarrhea, unspecified: Secondary | ICD-10-CM | POA: Insufficient documentation

## 2013-11-09 DIAGNOSIS — IMO0002 Reserved for concepts with insufficient information to code with codable children: Secondary | ICD-10-CM | POA: Insufficient documentation

## 2013-11-09 LAB — URINALYSIS, ROUTINE W REFLEX MICROSCOPIC
Bilirubin Urine: NEGATIVE
Glucose, UA: NEGATIVE mg/dL
HGB URINE DIPSTICK: NEGATIVE
Ketones, ur: NEGATIVE mg/dL
Nitrite: NEGATIVE
PH: 7 (ref 5.0–8.0)
Protein, ur: NEGATIVE mg/dL
Specific Gravity, Urine: 1.007 (ref 1.005–1.030)
Urobilinogen, UA: 0.2 mg/dL (ref 0.0–1.0)

## 2013-11-09 LAB — COMPREHENSIVE METABOLIC PANEL
ALBUMIN: 4.1 g/dL (ref 3.5–5.2)
ALT: 19 U/L (ref 0–35)
AST: 17 U/L (ref 0–37)
Alkaline Phosphatase: 68 U/L (ref 39–117)
BUN: 14 mg/dL (ref 6–23)
CALCIUM: 10.4 mg/dL (ref 8.4–10.5)
CHLORIDE: 96 meq/L (ref 96–112)
CO2: 29 meq/L (ref 19–32)
CREATININE: 0.83 mg/dL (ref 0.50–1.10)
GFR calc Af Amer: >90 mL/min (ref >90)
GFR, EST NON AFRICAN AMERICAN: 88 mL/min — AB (ref >90)
Glucose, Bld: 90 mg/dL (ref 70–99)
Potassium: 4.1 mEq/L (ref 3.7–5.3)
SODIUM: 138 meq/L (ref 137–147)
Total Bilirubin: 0.5 mg/dL (ref 0.3–1.2)
Total Protein: 7.9 g/dL (ref 6.0–8.3)

## 2013-11-09 LAB — CBC WITH DIFFERENTIAL/PLATELET
Basophils Absolute: 0 10*3/uL (ref 0.0–0.1)
Basophils Relative: 1 % (ref 0–1)
EOS PCT: 3 % (ref 0–5)
Eosinophils Absolute: 0.2 10*3/uL (ref 0.0–0.7)
HCT: 41.2 % (ref 36.0–46.0)
Hemoglobin: 14.5 g/dL (ref 12.0–15.0)
LYMPHS PCT: 26 % (ref 12–46)
Lymphs Abs: 1.8 10*3/uL (ref 0.7–4.0)
MCH: 30.6 pg (ref 26.0–34.0)
MCHC: 35.2 g/dL (ref 30.0–36.0)
MCV: 86.9 fL (ref 78.0–100.0)
MONO ABS: 0.4 10*3/uL (ref 0.1–1.0)
Monocytes Relative: 6 % (ref 3–12)
NEUTROS ABS: 4.5 10*3/uL (ref 1.7–7.7)
Neutrophils Relative %: 64 % (ref 43–77)
PLATELETS: 219 10*3/uL (ref 150–400)
RBC: 4.74 MIL/uL (ref 3.87–5.11)
RDW: 12.2 % (ref 11.5–15.5)
WBC: 7 10*3/uL (ref 4.0–10.5)

## 2013-11-09 LAB — URINE MICROSCOPIC-ADD ON

## 2013-11-09 LAB — LIPASE, BLOOD: Lipase: 32 U/L (ref 11–59)

## 2013-11-09 MED ORDER — DICYCLOMINE HCL 10 MG/ML IM SOLN
20.0000 mg | Freq: Once | INTRAMUSCULAR | Status: AC
  Administered 2013-11-10: 20 mg via INTRAMUSCULAR
  Filled 2013-11-09: qty 2

## 2013-11-09 MED ORDER — GI COCKTAIL ~~LOC~~
30.0000 mL | Freq: Once | ORAL | Status: AC
  Administered 2013-11-09: 30 mL via ORAL
  Filled 2013-11-09: qty 30

## 2013-11-09 NOTE — ED Provider Notes (Signed)
CSN: [id number]     Arrival date & time 11/09/13  1932 History   First MD Initiated Contact with Patient 11/09/13 2333     Chief Complaint  Patient presents with  . Abdominal Pain     (Consider location/radiation/quality/duration/timing/severity/associated sxs/prior Treatment) Patient is a 38 y.o. female presenting with abdominal pain.  Abdominal Pain Pain location:  LUQ Pain quality: cramping   Pain radiates to:  Does not radiate Pain severity:  Moderate Onset quality:  Gradual Duration:  2 days Timing:  Constant Progression:  Unchanged Chronicity:  New Context: suspicious food intake   Context: not recent travel   Relieved by:  Nothing Worsened by:  Nothing tried Ineffective treatments:  None tried Associated symptoms: diarrhea and nausea   Associated symptoms: no vomiting   Diarrhea:    Quality:  Watery   Severity:  Moderate   Timing:  Intermittent   Progression:  Unchanged Risk factors: no alcohol abuse     History reviewed. No pertinent past medical history. Past Surgical History  Procedure Laterality Date  . Foot surgery      RT foot-"2 titanium screws in my arch"  . Cesarean section      x2  . Mouth surgery    . Tubal ligation     No family history on file. History  Substance Use Topics  . Smoking status: Current Every Day Smoker -- 1.00 packs/day    Types: Cigarettes  . Smokeless tobacco: Not on file  . Alcohol Use: No   OB History   Grav Para Term Preterm Abortions TAB SAB Ect Mult Living                 Review of Systems  Gastrointestinal: Positive for nausea, abdominal pain and diarrhea. Negative for vomiting.  All other systems reviewed and are negative.      Allergies  Shellfish allergy; Mucinex; Penicillins; and Tobramycin-dexamethasone  Home Medications   Current Outpatient Rx  Name  Route  Sig  Dispense  Refill  . cetirizine (ZYRTEC) 10 MG tablet   Oral   Take 10 mg by mouth every evening.          .  ciprofloxacin-dexamethasone (CIPRODEX) otic suspension   Both Ears   Place 4 drops into both ears 2 (two) times daily.   7.5 mL   0   . ibuprofen (ADVIL,MOTRIN) 200 MG tablet   Oral   Take 800 mg by mouth every 8 (eight) hours as needed for pain.          BP 163/83  Pulse 80  Temp(Src) 98.5 F (36.9 C) (Oral)  Resp 16  SpO2 97%  LMP 10/26/2013 Physical Exam  Constitutional: She is oriented to person, place, and time. She appears well-developed and well-nourished. No distress.  HENT:  Head: Normocephalic and atraumatic.  Mouth/Throat: Oropharynx is clear and moist.  Eyes: Conjunctivae are normal. Pupils are equal, round, and reactive to light.  Neck: Normal range of motion. Neck supple.  Cardiovascular: Normal rate, regular rhythm and intact distal pulses.   Pulmonary/Chest: Effort normal and breath sounds normal. She has no wheezes. She has no rales.  Abdominal: Soft. Bowel sounds are increased. There is no tenderness. There is no rebound and no guarding.  Musculoskeletal: Normal range of motion.  Neurological: She is alert and oriented to person, place, and time.  Skin: Skin is warm and dry.  Psychiatric: She has a normal mood and affect.    ED Course  Procedures (including critical care  time) Labs Review Labs Reviewed  COMPREHENSIVE METABOLIC PANEL - Abnormal; Notable for the following:    GFR calc non Af Amer 88 (*)    All other components within normal limits  URINALYSIS, ROUTINE W REFLEX MICROSCOPIC - Abnormal; Notable for the following:    APPearance CLOUDY (*)    Leukocytes, UA SMALL (*)    All other components within normal limits  URINE MICROSCOPIC-ADD ON - Abnormal; Notable for the following:    Squamous Epithelial / LPF FEW (*)    All other components within normal limits  CBC WITH DIFFERENTIAL  LIPASE, BLOOD  POC URINE PREG, ED   Imaging Review No results found.  EKG Interpretation   None       MDM   Final diagnoses:  None    Exam and  vitals benign and reassuring. 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  Will treat for GERD and given strict abdominal pain and diarrhea return instructions.  Follow up with your primary doctor for ongoing care    Arieh Bogue K Eliane Hammersmith-Rasch, MD 11/10/13 [id number]

## 2013-11-09 NOTE — ED Notes (Signed)
Pt c/o LUQ pain since Sunday.  States she did fall before then and fell on that side.  C/o nausea, diarrhea.

## 2013-11-10 ENCOUNTER — Emergency Department (HOSPITAL_COMMUNITY): Payer: BC Managed Care – PPO

## 2013-11-10 MED ORDER — OMEPRAZOLE 20 MG PO CPDR: 20.0000 mg | DELAYED_RELEASE_CAPSULE | Freq: Every day | ORAL | Status: DC

## 2013-11-10 MED ORDER — NITROFURANTOIN MONOHYD MACRO 100 MG PO CAPS: 100.0000 mg | ORAL_CAPSULE | Freq: Two times a day (BID) | ORAL | Status: DC

## 2014-02-23 ENCOUNTER — Emergency Department (HOSPITAL_COMMUNITY): Payer: BC Managed Care – PPO

## 2014-02-23 ENCOUNTER — Emergency Department (HOSPITAL_COMMUNITY)
Admission: EM | Admit: 2014-02-23 | Discharge: 2014-02-23 | Disposition: A | Discharge status: Home / Self Care | Payer: BC Managed Care – PPO | Attending: Emergency Medicine | Admitting: Emergency Medicine

## 2014-02-23 ENCOUNTER — Encounter (HOSPITAL_COMMUNITY): Payer: Self-pay | Admitting: Emergency Medicine

## 2014-02-23 DIAGNOSIS — M79609 Pain in unspecified limb: Secondary | ICD-10-CM | POA: Insufficient documentation

## 2014-02-23 DIAGNOSIS — M79671 Pain in right foot: Secondary | ICD-10-CM

## 2014-02-23 DIAGNOSIS — F3289 Other specified depressive episodes: Secondary | ICD-10-CM | POA: Insufficient documentation

## 2014-02-23 DIAGNOSIS — Z79899 Other long term (current) drug therapy: Secondary | ICD-10-CM | POA: Insufficient documentation

## 2014-02-23 DIAGNOSIS — F329 Major depressive disorder, single episode, unspecified: Secondary | ICD-10-CM | POA: Insufficient documentation

## 2014-02-23 DIAGNOSIS — F17200 Nicotine dependence, unspecified, uncomplicated: Secondary | ICD-10-CM | POA: Insufficient documentation

## 2014-02-23 DIAGNOSIS — Z880 Allergy status to penicillin: Secondary | ICD-10-CM | POA: Insufficient documentation

## 2014-02-23 DIAGNOSIS — F411 Generalized anxiety disorder: Secondary | ICD-10-CM | POA: Insufficient documentation

## 2014-02-23 HISTORY — DX: Major depressive disorder, single episode, unspecified: F32.9

## 2014-02-23 HISTORY — DX: Anxiety disorder, unspecified: F41.9

## 2014-02-23 HISTORY — DX: Depression, unspecified: F32.A

## 2014-02-23 MED ORDER — TRAMADOL-ACETAMINOPHEN 37.5-325 MG PO TABS: 1.0000 | ORAL_TABLET | Freq: Four times a day (QID) | ORAL | Status: DC | PRN

## 2014-02-23 NOTE — ED Provider Notes (Signed)
Medical screening examination/treatment/procedure(s) were performed by non-physician practitioner and as supervising physician I was immediately available for consultation/collaboration.   EKG Interpretation None       Ethelda Chick, MD 02/23/14 2026

## 2014-02-23 NOTE — ED Notes (Signed)
Pt reports increased pain in r/ foot  x6 days. Pain is midfoot, on top. Pt feels that she  may have "bumped" foot one week ago

## 2014-02-23 NOTE — Discharge Instructions (Signed)
Take the prescribed medication as directed for pain.  May continue icing and elevating your foot to help with pain and swelling. Wear brace for added support. Follow-up with orthopedics-- call and schedule appt. Return to the ED for new or worsening symptoms.
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
## 2014-07-14 ENCOUNTER — Emergency Department (HOSPITAL_COMMUNITY): Payer: BC Managed Care – PPO

## 2014-07-14 ENCOUNTER — Encounter (HOSPITAL_COMMUNITY): Payer: Self-pay | Admitting: Emergency Medicine

## 2014-07-14 ENCOUNTER — Emergency Department (HOSPITAL_COMMUNITY)
Admission: EM | Admit: 2014-07-14 | Discharge: 2014-07-14 | Disposition: A | Discharge status: Home / Self Care | Payer: BC Managed Care – PPO | Attending: Emergency Medicine | Admitting: Emergency Medicine

## 2014-07-14 DIAGNOSIS — Z720 Tobacco use: Secondary | ICD-10-CM | POA: Diagnosis not present

## 2014-07-14 DIAGNOSIS — W108XXA Fall (on) (from) other stairs and steps, initial encounter: Secondary | ICD-10-CM | POA: Insufficient documentation

## 2014-07-14 DIAGNOSIS — Z79899 Other long term (current) drug therapy: Secondary | ICD-10-CM | POA: Insufficient documentation

## 2014-07-14 DIAGNOSIS — S81812A Laceration without foreign body, left lower leg, initial encounter: Secondary | ICD-10-CM

## 2014-07-14 DIAGNOSIS — S50311A Abrasion of right elbow, initial encounter: Secondary | ICD-10-CM | POA: Insufficient documentation

## 2014-07-14 DIAGNOSIS — S80211A Abrasion, right knee, initial encounter: Secondary | ICD-10-CM | POA: Insufficient documentation

## 2014-07-14 DIAGNOSIS — S81012A Laceration without foreign body, left knee, initial encounter: Secondary | ICD-10-CM | POA: Insufficient documentation

## 2014-07-14 DIAGNOSIS — R52 Pain, unspecified: Secondary | ICD-10-CM

## 2014-07-14 DIAGNOSIS — Z792 Long term (current) use of antibiotics: Secondary | ICD-10-CM | POA: Diagnosis not present

## 2014-07-14 DIAGNOSIS — Y9289 Other specified places as the place of occurrence of the external cause: Secondary | ICD-10-CM | POA: Diagnosis not present

## 2014-07-14 DIAGNOSIS — Z880 Allergy status to penicillin: Secondary | ICD-10-CM | POA: Diagnosis not present

## 2014-07-14 DIAGNOSIS — H938X3 Other specified disorders of ear, bilateral: Secondary | ICD-10-CM | POA: Insufficient documentation

## 2014-07-14 DIAGNOSIS — S80811A Abrasion, right lower leg, initial encounter: Secondary | ICD-10-CM

## 2014-07-14 DIAGNOSIS — Y9389 Activity, other specified: Secondary | ICD-10-CM | POA: Insufficient documentation

## 2014-07-14 DIAGNOSIS — Z8659 Personal history of other mental and behavioral disorders: Secondary | ICD-10-CM | POA: Diagnosis not present

## 2014-07-14 HISTORY — DX: Reaction to severe stress, unspecified: F43.9

## 2014-07-14 MED ORDER — LIDOCAINE HCL 2 % IJ SOLN
10.0000 mL | Freq: Once | INTRAMUSCULAR | Status: AC
  Administered 2014-07-14: 200 mg via INTRADERMAL
  Filled 2014-07-14: qty 20

## 2014-07-14 MED ORDER — NEOMYCIN-POLYMYXIN-HC 3.5-10000-1 OT SUSP: 4.0000 [drp] | Freq: Three times a day (TID) | OTIC | Status: DC

## 2014-07-14 MED ORDER — HYDROCODONE-ACETAMINOPHEN 5-325 MG PO TABS: 2.0000 | ORAL_TABLET | ORAL | Status: DC | PRN

## 2014-07-14 NOTE — ED Notes (Signed)
Pt fell down brick stairs around 1345 resulting in left knee laceration, right knee abrasion, and right elbow abrasion.

## 2014-07-14 NOTE — ED Provider Notes (Signed)
Medical screening examination/treatment/procedure(s) were performed by non-physician practitioner and as supervising physician I was immediately available for consultation/collaboration.   EKG Interpretation None        Megan Docherty, MD 07/14/14 2149 

## 2014-07-14 NOTE — ED Notes (Signed)
Pt c/o left knee laceration after falling down three stairs. Pt has abrasion on right knee and c/o elbow pain as well.

## 2014-07-14 NOTE — Discharge Instructions (Signed)
Otitis Externa Otitis externa is a bacterial or fungal infection of the outer ear canal. This is the area from the eardrum to the outside of the ear. Otitis externa is sometimes called "swimmer's ear." CAUSES  Possible causes of infection include: Swimming in dirty water. Moisture remaining in the ear after swimming or bathing. Mild injury (trauma) to the ear. Objects stuck in the ear (foreign body). Cuts or scrapes (abrasions) on the outside of the ear. SIGNS AND SYMPTOMS  The first symptom of infection is often itching in the ear canal. Later signs and symptoms may include swelling and redness of the ear canal, ear pain, and yellowish-white fluid (pus) coming from the ear. The ear pain may be worse when pulling on the earlobe. DIAGNOSIS  Your health care provider will perform a physical exam. A sample of fluid may be taken from the ear and examined for bacteria or fungi. TREATMENT  Antibiotic ear drops are often given for 10 to 14 days. Treatment may also include pain medicine or corticosteroids to reduce itching and swelling. HOME CARE INSTRUCTIONS  Apply antibiotic ear drops to the ear canal as prescribed by your health care provider. Take medicines only as directed by your health care provider. If you have diabetes, follow any additional treatment instructions from your health care provider. Keep all follow-up visits as directed by your health care provider. PREVENTION  Keep your ear dry. Use the corner of a towel to absorb water out of the ear canal after swimming or bathing. Avoid scratching or putting objects inside your ear. This can damage the ear canal or remove the protective wax that lines the canal. This makes it easier for bacteria and fungi to grow. Avoid swimming in lakes, polluted water, or poorly chlorinated pools. You may use ear drops made of rubbing alcohol and vinegar after swimming. Combine equal parts of white vinegar and alcohol in a bottle. Put 3 or 4 drops into  each ear after swimming. SEEK MEDICAL CARE IF:  You have a fever. Your ear is still red, swollen, painful, or draining pus after 3 days. Your redness, swelling, or pain gets worse. You have a severe headache. You have redness, swelling, pain, or tenderness in the area behind your ear. MAKE SURE YOU:  Understand these instructions. Will watch your condition. Will get help right away if you are not doing well or get worse. Document Released: 09/01/2005 Document Revised: 01/16/2014 Document Reviewed: 09/18/2011 [facility] Patient Information 2015 [place], [place]. This information is not intended to replace advice given to you by your health care provider. Make sure you discuss any questions you have with your health care provider. Laceration Care, Adult A laceration is a cut or lesion that goes through all layers of the skin and into the tissue just beneath the skin. TREATMENT  Some lacerations may not require closure. Some lacerations may not be able to be closed due to an increased risk of infection. It is important to see your caregiver as soon as possible after an injury to minimize the risk of infection and maximize the opportunity for successful closure. If closure is appropriate, pain medicines may be given, if needed. The wound will be cleaned to help prevent infection. Your caregiver will use stitches (sutures), staples, wound glue (adhesive), or skin adhesive strips to repair the laceration. These tools bring the skin edges together to allow for faster healing and a better cosmetic outcome. However, all wounds will heal with a scar. Once the wound has healed, scarring can  be minimized by covering the wound with sunscreen during the day for 1 full year. HOME CARE INSTRUCTIONS  For sutures or staples:  Keep the wound clean and dry.  If you were given a bandage (dressing), you should change it at least once a day. Also, change the dressing if it becomes wet or dirty, or as directed by your  caregiver.  Wash the wound with soap and water 2 times a day. Rinse the wound off with water to remove all soap. Pat the wound dry with a clean towel.  After cleaning, apply a thin layer of the antibiotic ointment as recommended by your caregiver. This will help prevent infection and keep the dressing from sticking.  You may shower as usual after the first 24 hours. Do not soak the wound in water until the sutures are removed.  Only take over-the-counter or prescription medicines for pain, discomfort, or fever as directed by your caregiver.  Get your sutures or staples removed as directed by your caregiver. For skin adhesive strips:  Keep the wound clean and dry.  Do not get the skin adhesive strips wet. You may bathe carefully, using caution to keep the wound dry.  If the wound gets wet, pat it dry with a clean towel.  Skin adhesive strips will fall off on their own. You may trim the strips as the wound heals. Do not remove skin adhesive strips that are still stuck to the wound. They will fall off in time. For wound adhesive:  You may briefly wet your wound in the shower or bath. Do not soak or scrub the wound. Do not swim. Avoid periods of heavy perspiration until the skin adhesive has fallen off on its own. After showering or bathing, gently pat the wound dry with a clean towel.  Do not apply liquid medicine, cream medicine, or ointment medicine to your wound while the skin adhesive is in place. This may loosen the film before your wound is healed.  If a dressing is placed over the wound, be careful not to apply tape directly over the skin adhesive. This may cause the adhesive to be pulled off before the wound is healed.  Avoid prolonged exposure to sunlight or tanning lamps while the skin adhesive is in place. Exposure to ultraviolet light in the first year will darken the scar.  The skin adhesive will usually remain in place for 5 to 10 days, then naturally fall off the skin. Do  not pick at the adhesive film. You may need a tetanus shot if:  You cannot remember when you had your last tetanus shot.  You have never had a tetanus shot. If you get a tetanus shot, your arm may swell, get red, and feel warm to the touch. This is common and not a problem. If you need a tetanus shot and you choose not to have one, there is a rare chance of getting tetanus. Sickness from tetanus can be serious. SEEK MEDICAL CARE IF:   You have redness, swelling, or increasing pain in the wound.  You see a red line that goes away from the wound.  You have yellowish-white fluid (pus) coming from the wound.  You have a fever.  You notice a bad smell coming from the wound or dressing.  Your wound breaks open before or after sutures have been removed.  You notice something coming out of the wound such as wood or glass.  Your wound is on your hand or foot and you cannot  move a finger or toe. SEEK IMMEDIATE MEDICAL CARE IF:   Your pain is not controlled with prescribed medicine.  You have severe swelling around the wound causing pain and numbness or a change in color in your arm, hand, leg, or foot.  Your wound splits open and starts bleeding.  You have worsening numbness, weakness, or loss of function of any joint around or beyond the wound.  You develop painful lumps near the wound or on the skin anywhere on your body. MAKE SURE YOU:   Understand these instructions.  Will watch your condition.  Will get help right away if you are not doing well or get worse. Document Released: 09/01/2005 Document Revised: 11/24/2011 Document Reviewed: 02/25/2011 [facility] Patient Information 2015 [place], [place]. This information is not intended to replace advice given to you by your health care provider. Make sure you discuss any questions you have with your health care provider.

## 2014-07-14 NOTE — ED Provider Notes (Signed)
CSN: [id number]     Arrival date & time 07/14/14  1508 History  This chart was scribed for non-physician practitioner, Langston MaskerKaren Zakkiyya Barno, PA-C working with Toy CookeyMegan Docherty, MD by Greggory StallionKayla Andersen, ED scribe. This patient was seen in room WTR6/WTR6 and the patient's care was started at 4:07 PM.   Chief Complaint  Patient presents with  . Fall  . Extremity Laceration    left knee   The history is provided by the patient. No language interpreter was used.   HPI Comments: Shannon Parrish is a 38 y.o. female who presents to the Emergency Department complaining of a fall that occurred prior to arrival. States she slipped on an acorn and fell down 3-4 brick steps. Denies hitting her head or LOC. Reports a laceration and pain to her left knee and an abrasion to her right knee and right elbow. Pt's tetanus has been within the last 5 years. Pt is also complaining of bilateral ear pain, left worse than right. Reports some drainage from the right ear. States she has chronic ear infections. Denies recent swimming.    Past Medical History  Diagnosis Date  . Anxiety   . Depression   . Stress    Past Surgical History  Procedure Laterality Date  . Foot surgery      RT foot-"2 titanium screws in my arch"  . Cesarean section      x2  . Mouth surgery    . Tubal ligation     Family History  Problem Relation Age of Onset  . Hypertension Mother   . Cancer Other   . Thyroid disease Other    History  Substance Use Topics  . Smoking status: Current Every Day Smoker -- 1.00 packs/day    Types: Cigarettes  . Smokeless tobacco: Not on file  . Alcohol Use: No   OB History   Grav Para Term Preterm Abortions TAB SAB Ect Mult Living                 Review of Systems  HENT: Positive for ear discharge and ear pain.   Musculoskeletal: Positive for arthralgias.  Skin: Positive for wound.  All other systems reviewed and are negative.  Allergies  Shellfish allergy; Mucinex; Penicillins; and  Tobramycin-dexamethasone  Home Medications   Prior to Admission medications   Medication Sig Start Date End Date Taking? Authorizing Provider  cetirizine (ZYRTEC) 10 MG tablet Take 10 mg by mouth every evening.     Historical Provider, MD  ciprofloxacin-dexamethasone (CIPRODEX) otic suspension Place 4 drops into both ears 2 (two) times daily. 09/16/13   Kaitlyn Szekalski, PA-C  ibuprofen (ADVIL,MOTRIN) 200 MG tablet Take 800 mg by mouth every 8 (eight) hours as needed for pain.    Historical Provider, MD  nitrofurantoin, macrocrystal-monohydrate, (MACROBID) 100 MG capsule Take 1 capsule (100 mg total) by mouth 2 (two) times daily. X 7 days 11/10/13   April K Palumbo-Rasch, MD  omeprazole (PRILOSEC) 20 MG capsule Take 1 capsule (20 mg total) by mouth daily. 11/10/13   April K Palumbo-Rasch, MD  traMADol-acetaminophen (ULTRACET) 37.5-325 MG per tablet Take 1 tablet by mouth every 6 (six) hours as needed. 02/23/14   Garlon HatchetLisa M Sanders, PA-C   BP 114/51  Pulse 96  Temp(Src) 97.9 F (36.6 C) (Oral)  Resp 18  SpO2 100%  Physical Exam  Nursing note and vitals reviewed. Constitutional: She is oriented to person, place, and time. She appears well-developed and well-nourished. No distress.  HENT:  Head: Normocephalic  and atraumatic.  Bilateral ear canals edematous, draining. TMs clear.  Eyes: EOM are normal.  Neck: Normal range of motion.  Cardiovascular: Normal rate, regular rhythm and normal heart sounds.   Pulmonary/Chest: Effort normal and breath sounds normal.  Abdominal: Soft. She exhibits no distension. There is no tenderness.  Musculoskeletal: Normal range of motion.  Neurological: She is alert and oriented to person, place, and time.  Skin: Skin is warm and dry.  Abrasion to right elbow. Superficial abrasion to right knee. 2 cm gaping laceration to left knee with surrounding abrasion.    Psychiatric: She has a normal mood and affect. Judgment normal.    ED Course  Procedures (including  critical care time)  LACERATION REPAIR PROCEDURE NOTE The patient's identification was confirmed and consent was obtained. This procedure was performed by Langston MaskerKaren Sakiya Stepka, PA-C at 5:35 PM. Site: left knee Sterile procedures observed Anesthetic used (type and amt): 6 mL 2% lidocaine without epi Suture type/size: 4-0 Prolene Length: 2 cm # of Sutures: 7 Technique: simple interrupted Complexity: simple Antibx ointment applied Tetanus UTD Site anesthetized, irrigated with NS, explored without evidence of foreign body, wound well approximated, site covered with dry, sterile dressing.  Patient tolerated procedure well without complications. Instructions for care discussed verbally and patient provided with additional written instructions for homecare and f/u.   DIAGNOSTIC STUDIES: Oxygen Saturation is 100% on RA, normal by my interpretation.    COORDINATION OF CARE: 4:09 PM-Discussed treatment plan which includes knee xray and updating tetanus with pt at bedside and pt agreed to plan.   Labs Review Labs Reviewed - No data to display  Imaging Review Dg Knee Complete 4 Views Left  07/14/2014   CLINICAL DATA:  Left knee pain.  EXAM: LEFT KNEE - COMPLETE 4+ VIEW  COMPARISON:  None.  FINDINGS: No acute bony or joint abnormality. Soft tissue structures are unremarkable .  IMPRESSION: No acute bony or joint abnormality.   Electronically Signed   By: Maisie Fushomas  Register   On: 07/14/2014 17:00   Dg Knee Complete 4 Views Right  07/14/2014   CLINICAL DATA:  Patient fell down steps landing on knee.  Open wound  EXAM: RIGHT KNEE - COMPLETE 4+ VIEW  COMPARISON:  None.  FINDINGS: Frontal, lateral, and bilateral oblique views were obtained. There is no fracture, dislocation, or effusion. Joint spaces appear intact. No erosive change. No radiopaque foreign body.  IMPRESSION: No fracture or dislocation. No appreciable arthropathy. No radiopaque foreign body.   Electronically Signed   By: Bretta BangWilliam  Woodruff  M.D.   On: 07/14/2014 16:59     EKG Interpretation None      MDM   Final diagnoses:  Pain  Laceration of left leg excluding thigh, initial encounter  Abrasion of right leg, initial encounter  Abrasion of right elbow, initial encounter      I personally performed the services described in this documentation, which was scribed in my presence. The recorded information has been reviewed and is accurate.  Elson AreasLeslie K Diesel Lina, PA-C 07/14/14 2003

## 2014-08-25 ENCOUNTER — Encounter (HOSPITAL_COMMUNITY): Payer: Self-pay | Admitting: Emergency Medicine

## 2014-08-25 ENCOUNTER — Emergency Department (HOSPITAL_COMMUNITY)
Admission: EM | Admit: 2014-08-25 | Discharge: 2014-08-25 | Disposition: A | Discharge status: Home / Self Care | Payer: BC Managed Care – PPO | Attending: Emergency Medicine | Admitting: Emergency Medicine

## 2014-08-25 DIAGNOSIS — F329 Major depressive disorder, single episode, unspecified: Secondary | ICD-10-CM | POA: Diagnosis not present

## 2014-08-25 DIAGNOSIS — Z720 Tobacco use: Secondary | ICD-10-CM | POA: Insufficient documentation

## 2014-08-25 DIAGNOSIS — H109 Unspecified conjunctivitis: Secondary | ICD-10-CM | POA: Diagnosis not present

## 2014-08-25 DIAGNOSIS — H578 Other specified disorders of eye and adnexa: Secondary | ICD-10-CM | POA: Diagnosis present

## 2014-08-25 DIAGNOSIS — Z79899 Other long term (current) drug therapy: Secondary | ICD-10-CM | POA: Diagnosis not present

## 2014-08-25 DIAGNOSIS — F419 Anxiety disorder, unspecified: Secondary | ICD-10-CM | POA: Diagnosis not present

## 2014-08-25 DIAGNOSIS — Z880 Allergy status to penicillin: Secondary | ICD-10-CM | POA: Diagnosis not present

## 2014-08-25 DIAGNOSIS — J0100 Acute maxillary sinusitis, unspecified: Secondary | ICD-10-CM | POA: Insufficient documentation

## 2014-08-25 MED ORDER — AMOXICILLIN-POT CLAVULANATE 875-125 MG PO TABS: 1.0000 | ORAL_TABLET | Freq: Two times a day (BID) | ORAL | Status: DC

## 2014-08-25 MED ORDER — FLUORESCEIN SODIUM 1 MG OP STRP
1.0000 | ORAL_STRIP | Freq: Once | OPHTHALMIC | Status: AC
Start: 2014-08-25 — End: 2014-08-25
  Administered 2014-08-25: 1 via OPHTHALMIC
  Filled 2014-08-25: qty 1

## 2014-08-25 MED ORDER — TOBRAMYCIN 0.3 % OP SOLN: 1.0000 [drp] | OPHTHALMIC | Status: DC

## 2014-08-25 MED ORDER — BACITRACIN-POLYMYXIN B 500-10000 UNIT/GM OP OINT: 1.0000 "application " | TOPICAL_OINTMENT | Freq: Four times a day (QID) | OPHTHALMIC | Status: DC

## 2014-08-25 MED ORDER — TETRACAINE HCL 0.5 % OP SOLN
1.0000 [drp] | Freq: Once | OPHTHALMIC | Status: AC
  Administered 2014-08-25: 1 [drp] via OPHTHALMIC
  Filled 2014-08-25: qty 2

## 2014-08-25 NOTE — ED Provider Notes (Signed)
CSN: [id number]     Arrival date & time 08/25/14  2105 History  This chart was scribed for a non-physician practitioner, Antony MaduraKelly Khalon Cansler, PA-C working with Toy BakerAnthony T Allen, MD by [place] Peace, ED Scribe. The patient was seen in [facility]. The patient's care was started at 10:28 PM.      Chief Complaint  Patient presents with  . Eye Drainage    The history is provided by the patient. No language interpreter was used.  HPI Comments: Shannon Parrish is a 38 y.o. female who presents to the Emergency Department complaining of right eye pain onset 1 week with associated crusting, redness, and drainage. Pt reports that she has bilateral ear infections that she was given ear drops for after being seen her previously at ED. Pt also complains of sinus congestion that she has tried treating with Zyrtec and Sudafed without much relief. Notes tenderness around affected eye and increased sensitivity to light. She notes ear infection started in left ear, but spread to right eye. She adds that right ear is now worse than left ear and has been giving her trouble hearing. Pt is current everyday smoker. No associated fever, vision loss, difficulty swallowing, or SOB.   Past Medical History  Diagnosis Date  . Anxiety   . Depression   . Stress    Past Surgical History  Procedure Laterality Date  . Foot surgery      RT foot-"2 titanium screws in my arch"  . Cesarean section      x2  . Mouth surgery    . Tubal ligation     Family History  Problem Relation Age of Onset  . Hypertension Mother   . Cancer Other   . Thyroid disease Other    History  Substance Use Topics  . Smoking status: Current Every Day Smoker -- 1.00 packs/day    Types: Cigarettes  . Smokeless tobacco: Not on file  . Alcohol Use: No   OB History    No data available      Review of Systems  HENT: Positive for congestion and ear pain. Negative for ear discharge.   Eyes: Positive for pain, discharge, redness, itching and visual  disturbance.  All other systems reviewed and are negative.   Allergies  Shellfish allergy; Mucinex; Penicillins; and Tobramycin-dexamethasone  Home Medications   Prior to Admission medications   Medication Sig Start Date End Date Taking? Authorizing Provider  cetirizine (ZYRTEC) 10 MG tablet Take 10 mg by mouth every evening.    Yes Historical Provider, MD  dextromethorphan-guaiFENesin (MUCINEX DM) 30-600 MG per 12 hr tablet Take 1 tablet by mouth 2 (two) times daily.   Yes Historical Provider, MD  HYDROcodone-acetaminophen (NORCO/VICODIN) 5-325 MG per tablet Take 2 tablets by mouth every 4 (four) hours as needed. 07/14/14  Yes Lonia SkinnerLeslie K Sofia, PA-C  neomycin-polymyxin-hydrocortisone (CORTISPORIN) 3.5-10000-1 otic suspension Place 4 drops into both ears 3 (three) times daily. 07/14/14  Yes Lonia SkinnerLeslie K Sofia, PA-C  pseudoephedrine (SUDAFED) 120 MG 12 hr tablet Take 120 mg by mouth 2 (two) times daily.   Yes Historical Provider, MD  ciprofloxacin-dexamethasone (CIPRODEX) otic suspension Place 4 drops into both ears 2 (two) times daily. Patient not taking: Reported on 08/25/2014 09/16/13   Emilia BeckKaitlyn Szekalski, PA-C  ibuprofen (ADVIL,MOTRIN) 200 MG tablet Take 800 mg by mouth every 8 (eight) hours as needed for pain.    Historical Provider, MD  nitrofurantoin, macrocrystal-monohydrate, (MACROBID) 100 MG capsule Take 1 capsule (100 mg total) by mouth 2 (  two) times daily. X 7 days Patient not taking: Reported on 08/25/2014 11/10/13   April K Palumbo-Rasch, MD  omeprazole (PRILOSEC) 20 MG capsule Take 1 capsule (20 mg total) by mouth daily. Patient not taking: Reported on 08/25/2014 11/10/13   April K Palumbo-Rasch, MD  traMADol-acetaminophen (ULTRACET) 37.5-325 MG per tablet Take 1 tablet by mouth every 6 (six) hours as needed. Patient not taking: Reported on 08/25/2014 02/23/14   Garlon HatchetLisa M Sanders, PA-C   BP 125/59 mmHg  Pulse 102  Temp(Src) 98.7 F (37.1 C) (Oral)  Resp 20  SpO2 100%  LMP  08/01/2014  Physical Exam  Constitutional: She is oriented to person, place, and time. She appears well-developed and well-nourished. No distress.  Nontoxic/nonseptic appearing.  HENT:  Head: Normocephalic and atraumatic.  Right Ear: External ear normal. Tympanic membrane is bulging. Tympanic membrane is not perforated.  Left Ear: External ear normal. Tympanic membrane is erythematous. Tympanic membrane is not perforated.  Nose: Right sinus exhibits maxillary sinus tenderness. Right sinus exhibits no frontal sinus tenderness. Left sinus exhibits no maxillary sinus tenderness and no frontal sinus tenderness.  Mouth/Throat: Oropharynx is clear and moist. No oropharyngeal exudate.  Patient with audible nasal congestion. She has a bulging right tympanic membrane with purulent noted in the middle ear. Patient also with dull and mildly erythematous left tympanic membrane without bulging, retraction, or perforation. No evidence of mastoiditis bilaterally.  Eyes: EOM are normal. Pupils are equal, round, and reactive to light. Lids are everted and swept, no foreign bodies found. Right eye exhibits discharge. Right conjunctiva is injected. Right conjunctiva has no hemorrhage. No scleral icterus.  Slit lamp exam:      The right eye shows no corneal abrasion, no corneal flare, no corneal ulcer and no fluorescein uptake.  Pupils equal round and reactive to direct and consensual light. No consensual photophobia. EOMs normal. No pain with eye movement. No proptosis or hyphema. No uptake on fluorescein staining. IOP 11 with 95% CI in R eye. Snellen 20/25 OS and OD; 20/20 OU. Crusting and purulence noted on R eyelashes.  Neck: Normal range of motion.  No nuchal rigidity or meningismus  Pulmonary/Chest: Effort normal. No respiratory distress.  Musculoskeletal: Normal range of motion.  Neurological: She is alert and oriented to person, place, and time.  Skin: Skin is warm and dry. No rash noted. She is not  diaphoretic. No erythema. No pallor.  Psychiatric: She has a normal mood and affect. Her behavior is normal.  Nursing note and vitals reviewed.   ED Course  Procedures (including critical care time) Labs Review Labs Reviewed - No data to display  Imaging Review No results found.   EKG Interpretation None     Medications - No data to display  10:34 PM- Treatment plan was discussed with patient who verbalizes understanding and agrees.   MDM   Final diagnoses:  Right conjunctivitis  Acute maxillary sinusitis, recurrence not specified    38 year old female presents to the emergency department for right eye redness with purulent drainage and crusting. Physical exam findings consistent with bacterial conjunctivitis. This is likely secondary to sinusitis. Patient also noted to have bulging right tympanic membrane with purulence in the middle ear. No evidence of spread of infection to soft tissue. No evidence of mastoiditis bilaterally. No evidence of acute glaucoma or uveitis. No proptosis or hyphema. Patient to be treated with Augmentin for sinusitis as well as Polysporin for conjunctivitis. Patient referred to ophthalmology should symptoms persist or worsen. Return precautions provided  and patient agreeable to plan with no unaddressed concerns.  I personally performed the services described in this documentation, which was scribed in my presence. The recorded information has been reviewed and is accurate.   Filed Vitals:   08/25/14 2111  BP: 125/59  Pulse: 102  Temp: 98.7 F (37.1 C)  TempSrc: Oral  Resp: 20  SpO2: 100%     Antony Madura, PA-C 08/25/14 2351  Toy Baker, MD 08/26/14 [phone]

## 2014-08-25 NOTE — Discharge Instructions (Signed)

## 2014-08-25 NOTE — ED Notes (Signed)
Pt reports that for the past week she has been having R eye pain with redness and drainage for the past week. Pt reports that she has bilateral ear infection with ear drops prescribed that she has not been able to take as prescribed. Pt reports sinus congestion, unrelieved with Zyrtec and Sudafed at home.

## 2015-01-07 ENCOUNTER — Emergency Department (HOSPITAL_COMMUNITY)
Admission: EM | Admit: 2015-01-07 | Discharge: 2015-01-07 | Disposition: A | Discharge status: Home / Self Care | Payer: BC Managed Care – PPO | Attending: Emergency Medicine | Admitting: Emergency Medicine

## 2015-01-07 ENCOUNTER — Encounter (HOSPITAL_COMMUNITY): Payer: Self-pay | Admitting: *Deleted

## 2015-01-07 DIAGNOSIS — Z720 Tobacco use: Secondary | ICD-10-CM | POA: Insufficient documentation

## 2015-01-07 DIAGNOSIS — Z8659 Personal history of other mental and behavioral disorders: Secondary | ICD-10-CM | POA: Insufficient documentation

## 2015-01-07 DIAGNOSIS — Z79899 Other long term (current) drug therapy: Secondary | ICD-10-CM | POA: Diagnosis not present

## 2015-01-07 DIAGNOSIS — Z792 Long term (current) use of antibiotics: Secondary | ICD-10-CM | POA: Insufficient documentation

## 2015-01-07 DIAGNOSIS — J018 Other acute sinusitis: Secondary | ICD-10-CM

## 2015-01-07 DIAGNOSIS — Z880 Allergy status to penicillin: Secondary | ICD-10-CM | POA: Diagnosis not present

## 2015-01-07 DIAGNOSIS — R0981 Nasal congestion: Secondary | ICD-10-CM | POA: Diagnosis present

## 2015-01-07 LAB — BASIC METABOLIC PANEL
Anion gap: 8 (ref 5–15)
BUN: 20 mg/dL (ref 6–23)
CHLORIDE: 103 mmol/L (ref 96–112)
CO2: 28 mmol/L (ref 19–32)
CREATININE: 0.85 mg/dL (ref 0.50–1.10)
Calcium: 9.1 mg/dL (ref 8.4–10.5)
GFR calc Af Amer: >90 mL/min (ref >90)
GFR calc non Af Amer: 85 mL/min — ABNORMAL LOW (ref >90)
GLUCOSE: 111 mg/dL — AB (ref 70–99)
Potassium: 4.3 mmol/L (ref 3.5–5.1)
SODIUM: 139 mmol/L (ref 135–145)

## 2015-01-07 LAB — CBC
HCT: 41.2 % (ref 36.0–46.0)
Hemoglobin: 13.9 g/dL (ref 12.0–15.0)
MCH: 29.8 pg (ref 26.0–34.0)
MCHC: 33.7 g/dL (ref 30.0–36.0)
MCV: 88.2 fL (ref 78.0–100.0)
Platelets: 210 10*3/uL (ref 150–400)
RBC: 4.67 MIL/uL (ref 3.87–5.11)
RDW: 12.7 % (ref 11.5–15.5)
WBC: 9.1 10*3/uL (ref 4.0–10.5)

## 2015-01-07 MED ORDER — HYDROCODONE-ACETAMINOPHEN 5-325 MG PO TABS
1.0000 | ORAL_TABLET | Freq: Four times a day (QID) | ORAL | Status: DC | PRN
Start: 2015-01-07 — End: 2019-04-25

## 2015-01-07 MED ORDER — IPRATROPIUM-ALBUTEROL 0.5-2.5 (3) MG/3ML IN SOLN
3.0000 mL | Freq: Once | RESPIRATORY_TRACT | Status: AC
  Administered 2015-01-07: 3 mL via RESPIRATORY_TRACT
  Filled 2015-01-07: qty 3

## 2015-01-07 MED ORDER — SULFAMETHOXAZOLE-TRIMETHOPRIM 800-160 MG PO TABS: 1.0000 | ORAL_TABLET | Freq: Two times a day (BID) | ORAL | Status: AC

## 2015-01-07 NOTE — ED Notes (Signed)
A few days for congestion, dizziness, throat pain, plus multiple complaints, "eye drainage of congestion the same color coming out of eye  Like the color coming out of my nose" c/o pressure in ears

## 2015-01-07 NOTE — ED Provider Notes (Signed)
CSN: [id number]     Arrival date & time 01/07/15  1557 History   First MD Initiated Contact with Patient 01/07/15 2035     Chief Complaint  Patient presents with  . URI  . Nasal Congestion  . Dizziness     (Consider location/radiation/quality/duration/timing/severity/associated sxs/prior Treatment) Patient is a 39 y.o. female presenting with URI and dizziness. The history is provided by the patient (the pt complains of sinus congestion).  URI Presenting symptoms: congestion and ear pain   Presenting symptoms: no cough and no fatigue   Severity:  Moderate Onset quality:  Sudden Timing:  Constant Progression:  Waxing and waning Chronicity:  New Associated symptoms: no headaches   Dizziness Associated symptoms: no chest pain, no diarrhea and no headaches     Past Medical History  Diagnosis Date  . Anxiety   . Depression   . Stress    Past Surgical History  Procedure Laterality Date  . Foot surgery      RT foot-"2 titanium screws in my arch"  . Cesarean section      x2  . Mouth surgery    . Tubal ligation     Family History  Problem Relation Age of Onset  . Hypertension Mother   . Cancer Other   . Thyroid disease Other    History  Substance Use Topics  . Smoking status: Current Every Day Smoker -- 1.00 packs/day    Types: Cigarettes  . Smokeless tobacco: Not on file  . Alcohol Use: No   OB History    No data available     Review of Systems  Constitutional: Negative for appetite change and fatigue.  HENT: Positive for congestion and ear pain. Negative for ear discharge and sinus pressure.   Eyes: Negative for discharge.  Respiratory: Negative for cough.   Cardiovascular: Negative for chest pain.  Gastrointestinal: Negative for abdominal pain and diarrhea.  Genitourinary: Negative for frequency and hematuria.  Musculoskeletal: Negative for back pain.  Skin: Negative for rash.  Neurological: Positive for dizziness. Negative for seizures and headaches.   Psychiatric/Behavioral: Negative for hallucinations.      Allergies  Shellfish allergy; Mucinex; Penicillins; and Tobramycin-dexamethasone  Home Medications   Prior to Admission medications   Medication Sig Start Date End Date Taking? Authorizing Provider  cetirizine (ZYRTEC) 10 MG tablet Take 10 mg by mouth every evening.    Yes Historical Provider, MD  ciprofloxacin-dexamethasone (CIPRODEX) otic suspension Place 4 drops into both ears 2 (two) times daily. Patient taking differently: Place 1 drop into both ears 2 (two) times daily as needed (ear infection).  09/16/13  Yes Kaitlyn Szekalski, PA-C  dextromethorphan-guaiFENesin (MUCINEX DM) 30-600 MG per 12 hr tablet Take 1 tablet by mouth 2 (two) times daily.   Yes Historical Provider, MD  ibuprofen (ADVIL,MOTRIN) 200 MG tablet Take 800 mg by mouth every 8 (eight) hours as needed for moderate pain (pain).    Yes Historical Provider, MD  pseudoephedrine (SUDAFED) 120 MG 12 hr tablet Take 120 mg by mouth 2 (two) times daily.   Yes Historical Provider, MD  amoxicillin-clavulanate (AUGMENTIN) 875-125 MG per tablet Take 1 tablet by mouth every 12 (twelve) hours. Patient not taking: Reported on 01/07/2015 08/25/14   Antony MaduraKelly Humes, PA-C  bacitracin-polymyxin b (POLYSPORIN) ophthalmic ointment Place 1 application into the right eye 4 (four) times daily. Apply for 7 days Patient not taking: Reported on 01/07/2015 08/25/14   Antony MaduraKelly Humes, PA-C  HYDROcodone-acetaminophen (NORCO/VICODIN) 5-325 MG per tablet Take 1 tablet by  mouth every 6 (six) hours as needed for moderate pain. 01/07/15   Bethann Berkshire, MD  neomycin-polymyxin-hydrocortisone (CORTISPORIN) 3.5-10000-1 otic suspension Place 4 drops into both ears 3 (three) times daily. Patient not taking: Reported on 01/07/2015 07/14/14   Elson Areas, PA-C  nitrofurantoin, macrocrystal-monohydrate, (MACROBID) 100 MG capsule Take 1 capsule (100 mg total) by mouth 2 (two) times daily. X 7 days Patient not  taking: Reported on 08/25/2014 11/10/13   April Palumbo, MD  omeprazole (PRILOSEC) 20 MG capsule Take 1 capsule (20 mg total) by mouth daily. Patient not taking: Reported on 08/25/2014 11/10/13   April Palumbo, MD  sulfamethoxazole-trimethoprim (BACTRIM DS,SEPTRA DS) 800-160 MG per tablet Take 1 tablet by mouth 2 (two) times daily. 01/07/15 01/14/15  Bethann Berkshire, MD  traMADol-acetaminophen (ULTRACET) 37.5-325 MG per tablet Take 1 tablet by mouth every 6 (six) hours as needed. Patient not taking: Reported on 08/25/2014 02/23/14   Garlon Hatchet, PA-C   BP 113/65 mmHg  Pulse 107  Temp(Src) 98.3 F (36.8 C) (Oral)  Resp 20  SpO2 97%  LMP 12/18/2014 Physical Exam  Constitutional: She is oriented to person, place, and time. She appears well-developed.  HENT:  Head: Normocephalic.  Tender frontal and maxillary sinuses.  Left tm inflamed  Eyes: Conjunctivae and EOM are normal. No scleral icterus.  Neck: Neck supple. No thyromegaly present.  Cardiovascular: Normal rate and regular rhythm.  Exam reveals no gallop and no friction rub.   No murmur heard. Pulmonary/Chest: No stridor. She has no wheezes. She has no rales. She exhibits no tenderness.  Abdominal: She exhibits no distension. There is no tenderness. There is no rebound.  Musculoskeletal: Normal range of motion. She exhibits no edema.  Lymphadenopathy:    She has no cervical adenopathy.  Neurological: She is oriented to person, place, and time. She exhibits normal muscle tone. Coordination normal.  Skin: No rash noted. No erythema.  Psychiatric: She has a normal mood and affect. Her behavior is normal.    ED Course  Procedures (including critical care time) Labs Review Labs Reviewed  BASIC METABOLIC PANEL - Abnormal; Notable for the following:    Glucose, Bld 111 (*)    GFR calc non Af Amer 85 (*)    All other components within normal limits  CBC    Imaging Review No results found.   EKG Interpretation None      MDM    Final diagnoses:  Other acute sinusitis    Sinusitis,  tx with bactrm and vicodin    Bethann Berkshire, MD 01/07/15 2200

## 2015-01-07 NOTE — Discharge Instructions (Signed)
Follow up next week for recheck with your md or an urgent care

## 2015-01-07 NOTE — ED Notes (Signed)
Pt provided several mask

## 2015-08-28 ENCOUNTER — Emergency Department (HOSPITAL_COMMUNITY)
Admission: EM | Admit: 2015-08-28 | Discharge: 2015-08-28 | Disposition: A | Discharge status: Home / Self Care | Payer: BC Managed Care – PPO | Attending: Emergency Medicine | Admitting: Emergency Medicine

## 2015-08-28 ENCOUNTER — Encounter (HOSPITAL_COMMUNITY): Payer: Self-pay | Admitting: Emergency Medicine

## 2015-08-28 DIAGNOSIS — T85848A Pain due to other internal prosthetic devices, implants and grafts, initial encounter: Secondary | ICD-10-CM | POA: Diagnosis not present

## 2015-08-28 DIAGNOSIS — Z880 Allergy status to penicillin: Secondary | ICD-10-CM | POA: Diagnosis not present

## 2015-08-28 DIAGNOSIS — Z8659 Personal history of other mental and behavioral disorders: Secondary | ICD-10-CM | POA: Insufficient documentation

## 2015-08-28 DIAGNOSIS — K029 Dental caries, unspecified: Secondary | ICD-10-CM | POA: Insufficient documentation

## 2015-08-28 DIAGNOSIS — Y752 Prosthetic and other implants, materials and neurological devices associated with adverse incidents: Secondary | ICD-10-CM | POA: Diagnosis not present

## 2015-08-28 DIAGNOSIS — Z9851 Tubal ligation status: Secondary | ICD-10-CM | POA: Insufficient documentation

## 2015-08-28 DIAGNOSIS — Z79899 Other long term (current) drug therapy: Secondary | ICD-10-CM | POA: Diagnosis not present

## 2015-08-28 DIAGNOSIS — K0889 Other specified disorders of teeth and supporting structures: Secondary | ICD-10-CM | POA: Diagnosis present

## 2015-08-28 DIAGNOSIS — N6452 Nipple discharge: Secondary | ICD-10-CM | POA: Insufficient documentation

## 2015-08-28 DIAGNOSIS — H6092 Unspecified otitis externa, left ear: Secondary | ICD-10-CM

## 2015-08-28 DIAGNOSIS — F17210 Nicotine dependence, cigarettes, uncomplicated: Secondary | ICD-10-CM | POA: Insufficient documentation

## 2015-08-28 MED ORDER — CLOTRIMAZOLE 1 % EX SOLN: 1.0000 "application " | Freq: Two times a day (BID) | CUTANEOUS | Status: DC

## 2015-08-28 MED ORDER — CEPHALEXIN 500 MG PO CAPS: 500.0000 mg | ORAL_CAPSULE | Freq: Four times a day (QID) | ORAL | Status: DC

## 2015-08-28 NOTE — Discharge Instructions (Signed)
Apply clotrimazole solution to the left ear twice a day. Take antibiotic as prescribed for possible dental and breast infection, take until all gone. Continue ibuprofen and Tylenol for pain. Follow-up with a dentist as referred. Also follow-up with breast center for mammogram.  Dental Pain Dental pain may be caused by many things, including:  Tooth decay (cavities or caries). Cavities expose the nerve of your tooth to air and hot or cold temperatures. This can cause pain or discomfort.  Abscess or infection. A dental abscess is a collection of infected pus from a bacterial infection in the inner part of the tooth (pulp). It usually occurs at the end of the tooth's root.  Injury.  An unknown reason (idiopathic). Your pain may be mild or severe. It may only occur when:  You are chewing.  You are exposed to hot or cold temperature.  You are eating or drinking sugary foods or beverages, such as soda or candy. Your pain may also be constant. HOME CARE INSTRUCTIONS Watch your dental pain for any changes. The following actions may help to lessen any discomfort that you are feeling:  Take medicines only as directed by your dentist.  If you were prescribed an antibiotic medicine, finish all of it even if you start to feel better.  Keep all follow-up visits as directed by your dentist. This is important.  Do not apply heat to the outside of your face.  Rinse your mouth or gargle with salt water if directed by your dentist. This helps with pain and swelling.  You can make salt water by adding  tsp of salt to 1 cup of warm water.  Apply ice to the painful area of your face:  Put ice in a plastic bag.  Place a towel between your skin and the bag.  Leave the ice on for 20 minutes, 2-3 times per day.  Avoid foods or drinks that cause you pain, such as:  Very hot or very cold foods or drinks.  Sweet or sugary foods or drinks. SEEK MEDICAL CARE IF:  Your pain is not controlled with  medicines.  Your symptoms are worse.  You have new symptoms. SEEK IMMEDIATE MEDICAL CARE IF:  You are unable to open your mouth.  You are having trouble breathing or swallowing.  You have a fever.  Your face, neck, or jaw is swollen.   This information is not intended to replace advice given to you by your health care provider. Make sure you discuss any questions you have with your health care provider.   Document Released: 09/01/2005 Document Revised: 01/16/2015 Document Reviewed: 08/28/2014 Elsevier Interactive Patient Education 2016 [employer]   Otitis Externa Otitis externa is a bacterial or fungal infection of the outer ear canal. This is the area from the eardrum to the outside of the ear. Otitis externa is sometimes called "swimmer's ear." CAUSES  Possible causes of infection include:  Swimming in dirty water.  Moisture remaining in the ear after swimming or bathing.  Mild injury (trauma) to the ear.  Objects stuck in the ear (foreign body).  Cuts or scrapes (abrasions) on the outside of the ear. SIGNS AND SYMPTOMS  The first symptom of infection is often itching in the ear canal. Later signs and symptoms may include swelling and redness of the ear canal, ear pain, and yellowish-white fluid (pus) coming from the ear. The ear pain may be worse when pulling on the earlobe. DIAGNOSIS  Your health care provider will perform a physical exam.  A sample of fluid may be taken from the ear and examined for bacteria or fungi. TREATMENT  Antibiotic ear drops are often given for 10 to 14 days. Treatment may also include pain medicine or corticosteroids to reduce itching and swelling. HOME CARE INSTRUCTIONS   Apply antibiotic ear drops to the ear canal as prescribed by your health care provider.  Take medicines only as directed by your health care provider.  If you have diabetes, follow any additional treatment instructions from your health care provider.  Keep all  follow-up visits as directed by your health care provider. PREVENTION   Keep your ear dry. Use the corner of a towel to absorb water out of the ear canal after swimming or bathing.  Avoid scratching or putting objects inside your ear. This can damage the ear canal or remove the protective wax that lines the canal. This makes it easier for bacteria and fungi to grow.  Avoid swimming in lakes, polluted water, or poorly chlorinated pools.  You may use ear drops made of rubbing alcohol and vinegar after swimming. Combine equal parts of white vinegar and alcohol in a bottle. Put 3 or 4 drops into each ear after swimming. SEEK MEDICAL CARE IF:   You have a fever.  Your ear is still red, swollen, painful, or draining pus after 3 days.  Your redness, swelling, or pain gets worse.  You have a severe headache.  You have redness, swelling, pain, or tenderness in the area behind your ear. MAKE SURE YOU:   Understand these instructions.  Will watch your condition.  Will get help right away if you are not doing well or get worse.   This information is not intended to replace advice given to you by your health care provider. Make sure you discuss any questions you have with your health care provider.   Document Released: 09/01/2005 Document Revised: 09/22/2014 Document Reviewed: 09/18/2011 Elsevier Interactive Patient Education [employer]

## 2015-08-28 NOTE — ED Notes (Signed)
Pt c/o left ear pain/drainage, bilateral green breast drainage (CA runs in family-rectal, lung), two broken teeth on right lower mouth (says, "I don't have a dentist yet because I haven't needed one and don't have the money.) Has been taking 800 mg Ibuprofen for pain but no alleviation anymore. Denies N/V/D/fevers but endorses vertigo. Endorses "a little stomach pain." No other c/c.

## 2015-08-28 NOTE — ED Provider Notes (Signed)
CSN: [id number]     Arrival date & time 08/28/15  1233 History   First MD Initiated Contact with Patient 08/28/15 1329     Chief Complaint  Patient presents with  . Breast Discharge  . Dental Pain  . Otalgia  . Ear Drainage     (Consider location/radiation/quality/duration/timing/severity/associated sxs/prior Treatment) HPI Shannon Parrish is a 39 y.o. female with hx of anxiety, depression, presents to ED with multiple complaints. Patient's main complaint is dental pain. Patient states she has 2 decayed teeth to the right lower jaw, states no insurance and unable to see a dentist. She states she does not have money to pay for a dentist. She reports surrounding gum swelling and pain to the touch. Unable to eat or drink without pain. Denies any fever or chills. Denies any facial swelling. Putting Orajel and taking ibuprofen for the pain with no relief. 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 Patient has recurrent infections to the ear. States she has seen ENT in the past who gave her fungal powder to put in there and clean the ear out. She states that infection improved but returned about a week ago. Patient states it is draining, painful to the touch. Patient is also complaining about her right breast discharge. She states that she has had a piercing and not breast, but states she took the piercing 3 years ago. Patient noticed over several days that she has had yellowish/green discharge to the nipple. She denies any pain in the breast. No masses or nodules palpated. Denies again any fever or chills. No skin changes to the breast.  Past Medical History  Diagnosis Date  . Anxiety   . Depression   . Stress    Past Surgical History  Procedure Laterality Date  . Foot surgery      RT foot-"2 titanium screws in my arch"  . Cesarean section      x2  . Mouth surgery    . Tubal ligation      Family History  Problem Relation Age of Onset  . Hypertension Mother   . Cancer Other   . Thyroid disease Other    Social History  Substance Use Topics  . Smoking status: Current Every Day Smoker -- 1.00 packs/day    Types: Cigarettes  . Smokeless tobacco: None  . Alcohol Use: No   OB History    No data available     Review of Systems  Constitutional: Negative for fever and chills.  HENT: Positive for congestion, dental problem, ear discharge, ear pain and postnasal drip. Negative for sore throat, trouble swallowing and voice change.   Respiratory: Negative for cough, chest tightness and shortness of breath.   Cardiovascular: Negative for chest pain, palpitations and leg swelling.  Gastrointestinal: Negative for nausea, vomiting, abdominal pain and diarrhea.  Musculoskeletal: Negative for myalgias, arthralgias, neck pain and neck stiffness.  Skin: Negative for rash.  Neurological: Negative for dizziness, weakness and headaches.  All other systems reviewed and are negative.     Allergies  Shellfish allergy; Ibuprofen; Mucinex; Penicillins; and Tobramycin-dexamethasone  Home Medications   Prior to Admission medications   Medication Sig Start Date End Date Taking? Authorizing Provider  cetirizine (ZYRTEC) 10 MG tablet Take 10 mg by mouth every evening.    Yes Historical Provider, MD  dextromethorphan-guaiFENesin (MUCINEX DM) 30-600 MG per 12 hr tablet Take 1  tablet by mouth 2 (two) times daily as needed for cough.    Yes Historical Provider, MD  HYDROcodone-acetaminophen (NORCO/VICODIN) 5-325 MG per tablet Take 1 tablet by mouth every 6 (six) hours as needed for moderate pain. 01/07/15  Yes Bethann BerkshireJoseph Zammit, MD  ibuprofen (ADVIL,MOTRIN) 200 MG tablet Take 800 mg by mouth every 8 (eight) hours as needed for moderate pain (pain).    Yes Historical Provider, MD  pseudoephedrine (SUDAFED) 120 MG 12 hr tablet Take 120 mg by mouth 2 (two) times daily.   Yes Historical Provider, MD    BP 111/80 mmHg  Pulse 93  Temp(Src) 97.6 F (36.4 C) (Oral)  Resp 19  SpO2 100%  LMP 08/21/2015 Physical Exam  Constitutional: She appears well-developed and well-nourished. No distress.  HENT:  Head: Normocephalic.  Dental caries and decay to the right lower 1st and 2nd bicuspids. Mild surrounding gum swelling and ttp. No obvious absess. No trismus. No swelling under the tongue. Right external ear, ear canal, TM normal. Left external ear with mild erythema and scaling. Scaly rash to the ear canal with crud like discharge  Eyes: Conjunctivae are normal.  Neck: Normal range of motion. Neck supple.  Cardiovascular: Normal rate, regular rhythm and normal heart sounds.   Pulmonary/Chest: Effort normal and breath sounds normal. No respiratory distress. She has no wheezes. She has no rales.  Large pendulous breasts. Yellowish greenish discharge the right nipple. Normal skin. No erythema or warmth to the touch over the breast. No palpable masses or nodules.  Musculoskeletal: She exhibits no edema.  Neurological: She is alert.  Skin: Skin is warm and dry.  Psychiatric: She has a normal mood and affect. Her behavior is normal.  Nursing note and vitals reviewed.   ED Course  Procedures (including critical care time) Labs Review Labs Reviewed - No data to display  Imaging Review No results found. I have personally reviewed and evaluated these images and lab results as part of my medical decision-making.   EKG Interpretation None      MDM   Final diagnoses:  Dental implant pain, initial encounter  Otitis externa, left     patient will multiple complaints. For her dental pain or refer her to a dentist. Will start on antibiotic for possible early infection. Ibuprofen and Tylenol for pain. For her ear infection which is most likely fungal, will treat with clotrimazole solution. For her breast discharge I will send her to the breast center. Follow-up with primary care doctor as  well.  Patient is otherwise afebrile, nontoxic appearing. Stable for discharge home. Return precautions discussed  Filed Vitals:   08/28/15 1242  BP: 111/80  Pulse: 93  Temp: 97.6 F (36.4 C)  TempSrc: Oral  Resp: 19  SpO2: 100%     Jaynie Crumbleatyana Maurion Walkowiak, PA-C 08/28/15 1519  Azalia BilisKevin Campos, MD 08/28/15 1524

## 2015-09-27 ENCOUNTER — Other Ambulatory Visit (HOSPITAL_COMMUNITY): Payer: Self-pay | Admitting: Emergency Medicine

## 2015-09-27 DIAGNOSIS — N6452 Nipple discharge: Secondary | ICD-10-CM

## 2015-10-04 ENCOUNTER — Ambulatory Visit
Admission: RE | Admit: 2015-10-04 | Discharge: 2015-10-04 | Disposition: A | Discharge status: Home / Self Care | Payer: BC Managed Care – PPO | Source: Ambulatory Visit | Attending: Emergency Medicine | Admitting: Emergency Medicine

## 2015-10-04 DIAGNOSIS — N6452 Nipple discharge: Secondary | ICD-10-CM

## 2015-12-06 ENCOUNTER — Ambulatory Visit: Payer: Self-pay

## 2015-12-06 ENCOUNTER — Other Ambulatory Visit: Payer: Self-pay | Admitting: Occupational Medicine

## 2015-12-06 DIAGNOSIS — M25511 Pain in right shoulder: Secondary | ICD-10-CM

## 2015-12-18 ENCOUNTER — Other Ambulatory Visit: Payer: Self-pay | Admitting: Orthopedic Surgery

## 2015-12-18 DIAGNOSIS — M25511 Pain in right shoulder: Secondary | ICD-10-CM

## 2015-12-25 ENCOUNTER — Ambulatory Visit
Admission: RE | Admit: 2015-12-25 | Discharge: 2015-12-25 | Disposition: A | Discharge status: Home / Self Care | Payer: BC Managed Care – PPO | Source: Ambulatory Visit | Attending: Orthopedic Surgery | Admitting: Orthopedic Surgery

## 2015-12-25 DIAGNOSIS — M25511 Pain in right shoulder: Secondary | ICD-10-CM

## 2015-12-25 MED ORDER — IOHEXOL 180 MG/ML  SOLN
15.0000 mL | Freq: Once | INTRAMUSCULAR | Status: AC | PRN
  Administered 2015-12-25: 15 mL via INTRA_ARTICULAR

## 2016-12-24 ENCOUNTER — Other Ambulatory Visit: Payer: Self-pay | Admitting: *Deleted

## 2016-12-24 ENCOUNTER — Encounter: Payer: Self-pay | Admitting: *Deleted

## 2016-12-24 ENCOUNTER — Encounter: Payer: Self-pay | Admitting: Neurology

## 2016-12-24 ENCOUNTER — Ambulatory Visit (INDEPENDENT_AMBULATORY_CARE_PROVIDER_SITE_OTHER): Payer: Medicaid Other | Admitting: Neurology

## 2016-12-24 VITALS — BP 112/75 | HR 86 | Ht 66.0 in | Wt 258.5 lb

## 2016-12-24 DIAGNOSIS — G4489 Other headache syndrome: Secondary | ICD-10-CM | POA: Diagnosis not present

## 2016-12-24 MED ORDER — SUMATRIPTAN SUCCINATE 100 MG PO TABS: 100.0000 mg | ORAL_TABLET | Freq: Two times a day (BID) | ORAL | 2 refills | Status: DC | PRN

## 2016-12-24 NOTE — Progress Notes (Signed)
Reason for visit: Headache  Referring physician: Dr. [name] LIEBER is a 41 y.o. female  History of present illness:  Shannon Parrish is a 41 year old right-handed white female with a history of episodic headaches. The patient claimed that these occur on average twice a month, and may last anywhere from a few minutes to all day long. The headaches occur in the back of the head, and seem to be induced by something touching the back of the head or upper neck. The patient may have some occasional brief episodes of vertigo she turns her head a certain way. The patient denies any nausea or vomiting or any significant phonophobia, she may have some photophobia with the headaches at times. She denies any numbness or weakness of the face, arms, or legs. When she was a teenager and in her early 12s she would have occasional episodes of syncope if she tilted her head back and then came upright again. The patient had loss of vision and then subsequent syncope. This has not happened in over 20 years. The patient denies any balance issues or difficulty controlling the bowels or the bladder. She takes Motrin or Tylenol for her headache without benefit. The patient does have some slight right-sided neck and shoulder discomfort, she has had prior right shoulder surgery for a rotator cuff tear repair. In 2011 she had a brief episode of transient amnesia lasting only a few moments. This has not recurred. She is sent to this office for further evaluation.  Past Medical History:  Diagnosis Date  . Anxiety   . Depression   . Stress     Past Surgical History:  Procedure Laterality Date  . CESAREAN SECTION     x2  . FOOT SURGERY     RT foot-"2 titanium screws in my arch"  . MOUTH SURGERY    . TUBAL LIGATION      Family History  Problem Relation Age of Onset  . Hypertension Mother   . Cancer Other   . Thyroid disease Other     Social history:  reports that she has been smoking Cigarettes.  She has  been smoking about 1.00 pack per day. She does not have any smokeless tobacco history on file. She reports that she does not drink alcohol or use drugs.  Medications:  Prior to Admission medications   Medication Sig Start Date End Date Taking? Authorizing Provider  cetirizine (ZYRTEC) 10 MG tablet Take 10 mg by mouth every evening.     Historical Provider, MD  clotrimazole (LOTRIMIN) 1 % external solution Apply 1 application topically 2 (two) times daily. 08/28/15   Tatyana Kirichenko, PA-C  dextromethorphan-guaiFENesin (MUCINEX DM) 30-600 MG per 12 hr tablet Take 1 tablet by mouth 2 (two) times daily as needed for cough.     Historical Provider, MD  HYDROcodone-acetaminophen (NORCO/VICODIN) 5-325 MG per tablet Take 1 tablet by mouth every 6 (six) hours as needed for moderate pain. 01/07/15   Bethann Berkshire, MD  ibuprofen (ADVIL,MOTRIN) 200 MG tablet Take 800 mg by mouth every 8 (eight) hours as needed for moderate pain (pain).     Historical Provider, MD  pseudoephedrine (SUDAFED) 120 MG 12 hr tablet Take 120 mg by mouth 2 (two) times daily.    Historical Provider, MD      Allergies  Allergen Reactions  . Shellfish Allergy Anaphylaxis    headache  . Ibuprofen     Doesn't work  . Mucinex [Guaifenesin Er] Itching  Patient stated that she can take the DM   . Penicillins Nausea Only    Has patient had a PCN reaction causing immediate rash, facial/tongue/throat swelling, SOB or lightheadedness with hypotension: No Has patient had a PCN reaction causing severe rash involving mucus membranes or skin necrosis: No Has patient had a PCN reaction that required hospitalization No Has patient had a PCN reaction occurring within the last 10 years: No If all of the above answers are "NO", then may proceed with Cephalosporin use.   . Tobramycin-Dexamethasone     Made eyes swell even worse    ROS:  Out of a complete 14 system review of symptoms, the patient complains only of the following  symptoms, and all other reviewed systems are negative.  Headache, numbness, dizziness  Height  (1.676 m), weight 258 lb 8 oz (117.3 kg).  Physical Exam  General: The patient is alert and cooperative at the time of the examination. The patient is moderately to markedly obese.  Eyes: Pupils are equal, round, and reactive to light. Discs are flat bilaterally.  Neck: The neck is supple, no carotid bruits are noted.  Respiratory: The respiratory examination is clear.  Cardiovascular: The cardiovascular examination reveals a regular rate and rhythm, no obvious murmurs or rubs are noted.  Neuromuscular: Range of movement of the cervical spine is full.  Skin: Extremities are without significant edema.  Neurologic Exam  Mental status: The patient is alert and oriented x 3 at the time of the examination. The patient has apparent normal recent and remote memory, with an apparently normal attention span and concentration ability.  Cranial nerves: Facial symmetry is present. There is good sensation of the face to pinprick and soft touch bilaterally. The strength of the facial muscles and the muscles to head turning and shoulder shrug are normal bilaterally. Speech is well enunciated, no aphasia or dysarthria is noted. Extraocular movements are full. Visual fields are full. The tongue is midline, and the patient has symmetric elevation of the soft palate. No obvious hearing deficits are noted.  Motor: The motor testing reveals 5 over 5 strength of all 4 extremities. Good symmetric motor tone is noted throughout.  Sensory: Sensory testing is intact to pinprick, soft touch, vibration sensation, and position sense on all 4 extremities. No evidence of extinction is noted.  Coordination: Cerebellar testing reveals good finger-nose-finger and heel-to-shin bilaterally.  Gait and station: Gait is normal. Tandem gait is normal. Romberg is negative. No drift is seen.  Reflexes: Deep tendon reflexes  are symmetric and normal bilaterally. Toes are downgoing bilaterally.   Assessment/Plan:  1. Intermittent headache  2. Remote history of syncope  3. Intermittent vertigo  The patient is having intermittent headaches, over-the-counter medications have not been beneficial. I will give her a trial on Imitrex to take if needed. Fortunately, the headaches are not frequent occurring once or twice a month. The patient does not correlate the headaches in the past with the episodes of syncope. She will be sent for MRI evaluation of the brain. She will follow-up in about 5 months. The recent ophthalmologic evaluation was normal, no evidence of papilledema.  Marlan Palau MD 12/24/2016 8:40 AM  [facility] [address] [place], [place] [address]  Phone [phone] Fax [phone]

## 2016-12-24 NOTE — Patient Instructions (Signed)
   We will check MRI of the brain and try Imitrex for the headache.

## 2016-12-24 NOTE — Progress Notes (Signed)
Called and cx rx sumatriptan sent to [facility]. Re-ordered and sent to [facility] [address]. This is pt preferred pharmacy.

## 2016-12-30 ENCOUNTER — Other Ambulatory Visit: Payer: Self-pay | Admitting: Neurology

## 2017-01-06 ENCOUNTER — Ambulatory Visit
Admission: RE | Admit: 2017-01-06 | Discharge: 2017-01-06 | Disposition: A | Discharge status: Home / Self Care | Payer: Medicaid Other | Source: Ambulatory Visit | Attending: Neurology | Admitting: Neurology

## 2017-01-06 DIAGNOSIS — G4489 Other headache syndrome: Secondary | ICD-10-CM | POA: Diagnosis not present

## 2017-01-08 ENCOUNTER — Telehealth: Payer: Self-pay | Admitting: Neurology

## 2017-01-08 NOTE — Telephone Encounter (Signed)
I called patient. MRI the brain was normal.  MRI brain 01/08/17:  IMPRESSION:  Normal MRI brain (without).

## 2017-07-02 ENCOUNTER — Ambulatory Visit: Payer: Medicaid Other | Admitting: Neurology

## 2018-11-20 IMAGING — MR MR HEAD W/O CM
10 series · 48 of 48 positions shown · non-contrast
Comparison: none

[Series 5: T1 · sagittal · 4.0mm · 0.75mm/px · 3 of 31 slices shown (1 of 2)]
[im 1/31]
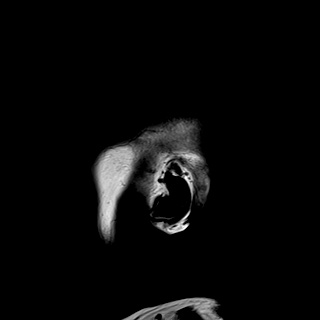
[im 16/31]
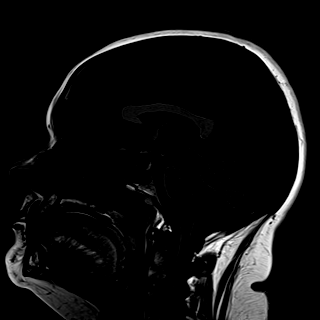
[im 31/31]
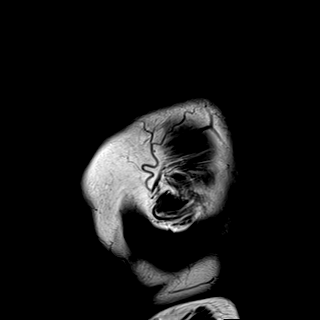

[Series 6: DWI · axial · 3.0mm · 1.44mm/px · z∈[-66,+72]mm · 7 of 86 slices shown (1 of 4)]
[im 1/86]
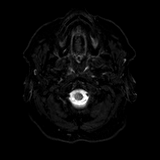
[im 15/86]
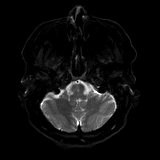
[im 29/86]
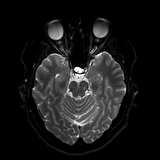
[im 43/86]
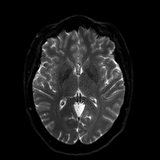
[im 57/86]
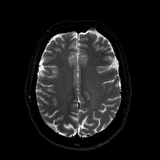
[im 71/86]
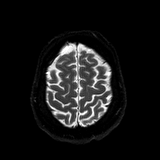
[im 86/86]
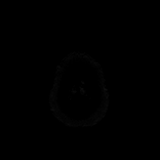

[Series 7: DWI · axial · 3.0mm · 1.44mm/px · z∈[-66,+72]mm · 4 of 43 slices shown (2 of 4)]
[im 1/43]
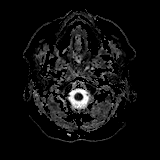
[im 15/43]
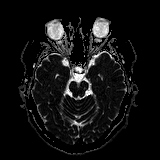
[im 29/43]
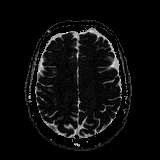
[im 43/43]
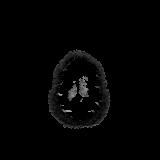

[Series 8: DWI · coronal · 5.0mm · 1.44mm/px · 5 of 60 slices shown (3 of 4)]
[im 1/60]
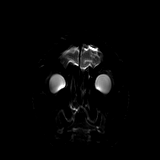
[im 15/60]
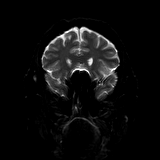
[im 30/60]
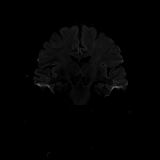
[im 45/60]
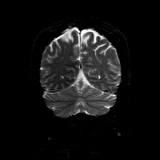
[im 60/60]
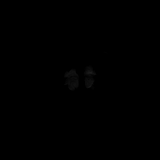

[Series 9: DWI · coronal · 5.0mm · 1.44mm/px · 3 of 30 slices shown (4 of 4)]
[im 1/30]
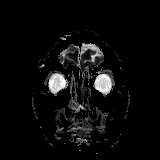
[im 15/30]
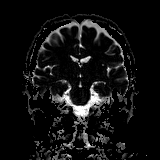
[im 30/30]
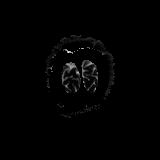

[Series 10: T2 · axial · 4.0mm · 0.36mm/px · z∈[-72,+74]mm · 2 of 29 slices shown (1 of 2)]
[im 1/29]
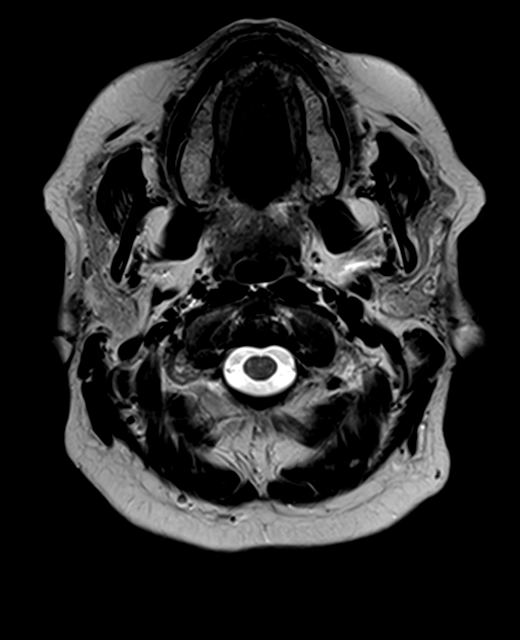
[im 29/29]
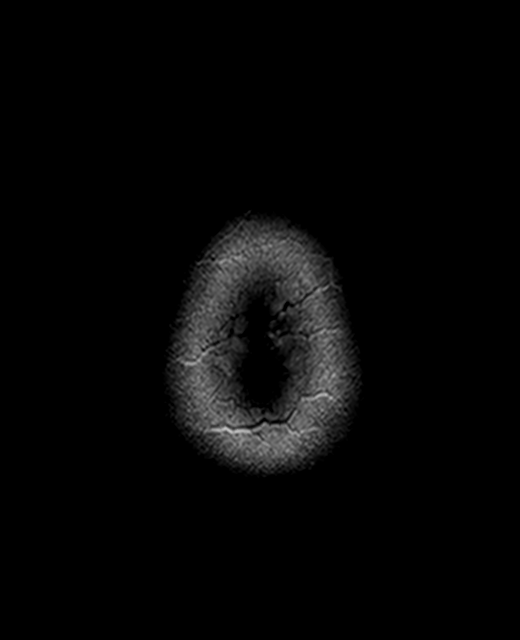

[Series 11: FLAIR · axial · 3.0mm · 0.72mm/px · z∈[-74,+76]mm · 2 of 26 slices shown]
[im 1/26]
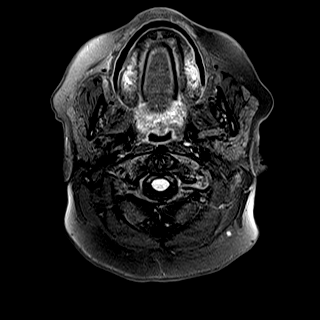
[im 26/26]
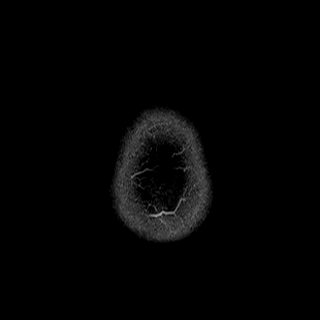

[Series 13: swi_images · axial · 1.5mm · 0.90mm/px · z∈[-70,+72]mm · 8 of 96 slices shown]
[im 1/96]
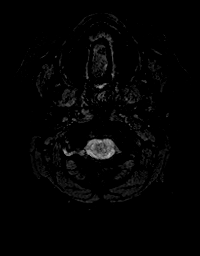
[im 14/96]
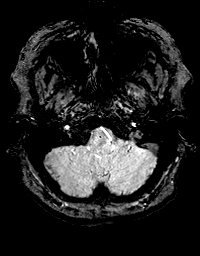
[im 28/96]
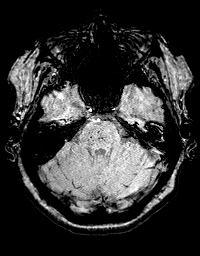
[im 41/96]
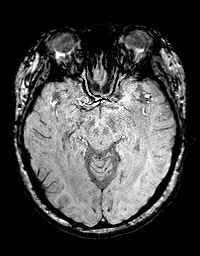
[im 55/96]
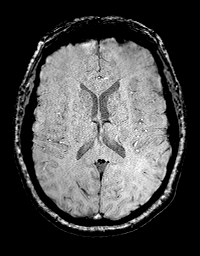
[im 68/96]
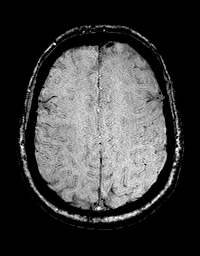
[im 82/96]
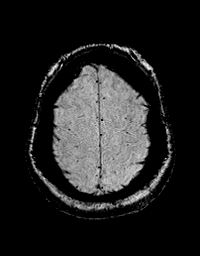
[im 96/96]
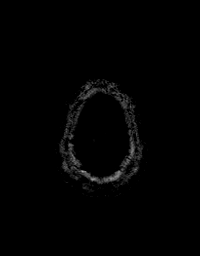

[Series 14: T1 · axial · 1.0mm · 0.90mm/px · z∈[-71,+72]mm · 12 of 144 slices shown (2 of 2)]
[im 1/144]
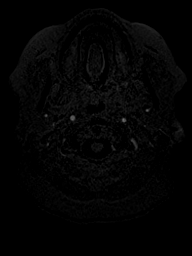
[im 14/144]
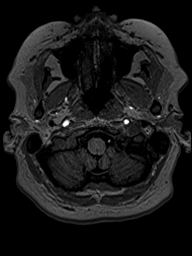
[im 27/144]
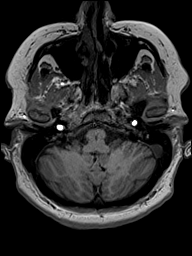
[im 40/144]
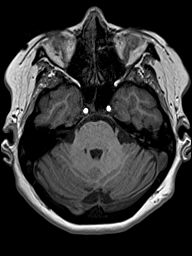
[im 53/144]
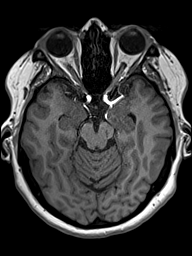
[im 66/144]
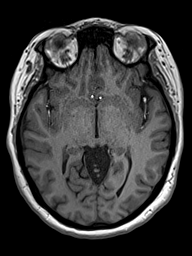
[im 79/144]
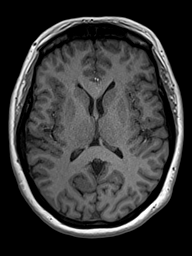
[im 92/144]
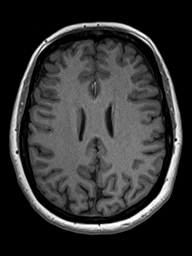
[im 105/144]
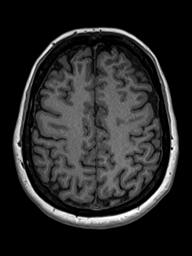
[im 118/144]
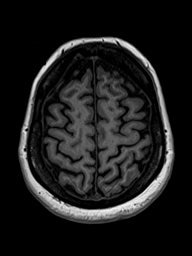
[im 131/144]
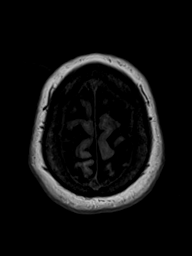
[im 144/144]
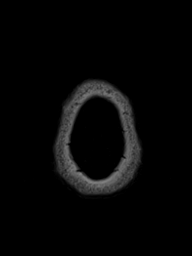

[Series 15: T2 · coronal · 4.5mm · 0.36mm/px · 2 of 29 slices shown (2 of 2)]
[im 1/29]
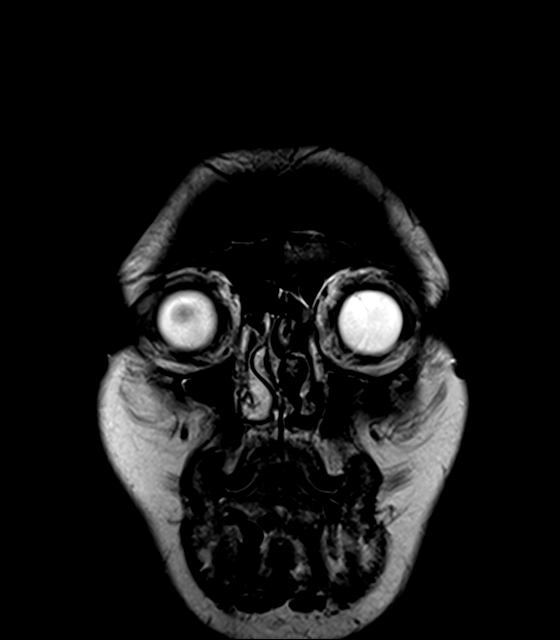
[im 29/29]
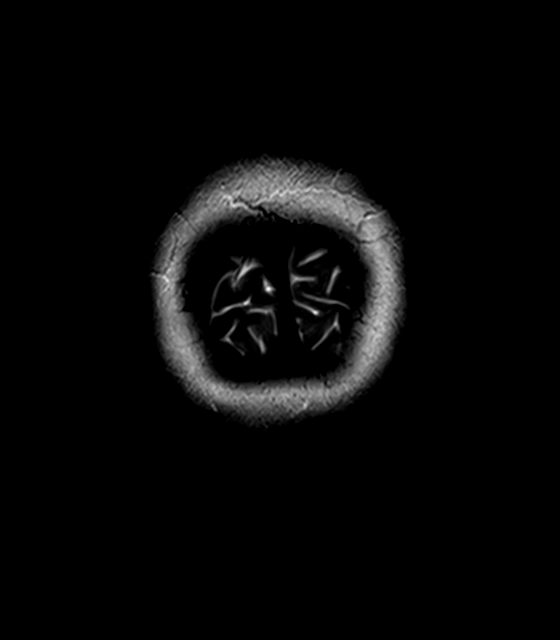

[48 of 48 positions shown; findings below may reference images not displayed]

Canned report from images found in remote index.

Refer to host system for actual result text.

## 2019-04-20 ENCOUNTER — Ambulatory Visit: Payer: Medicaid Other | Admitting: Diagnostic Neuroimaging

## 2019-04-25 ENCOUNTER — Encounter: Payer: Self-pay | Admitting: Neurology

## 2019-04-25 ENCOUNTER — Ambulatory Visit: Payer: Medicaid Other | Admitting: Neurology

## 2019-04-25 ENCOUNTER — Other Ambulatory Visit: Payer: Self-pay

## 2019-04-25 VITALS — BP 116/84 | HR 104 | Temp 98.2°F | Ht 67.5 in | Wt 256.3 lb

## 2019-04-25 DIAGNOSIS — R202 Paresthesia of skin: Secondary | ICD-10-CM | POA: Diagnosis not present

## 2019-04-25 NOTE — Progress Notes (Signed)
Reason for visit: Numbness of the hands  Referring physician: Dr. Angie Favasei-Bonsu  Shannon Parrish is a 43 y.o. female  History of present illness:  Shannon Parrish is a 43 year old right-handed white female with a history of anxiety and depression and a history of headaches.  The patient comes into the office today for evaluation of problems with numbness of the hands that have been present over the last several months to year.  The patient indicates that she had a recent left shoulder surgery for rotator cuff tear, she has noted some discomfort around the shoulder but she has noted numbness in the left hand predicted when she wakes up in the morning.  The patient may also note numbness when she does certain activities during the day.  The numbness affects the thumb, index finger, and middle finger primarily.  She is having some symptoms on the right but to a much lesser degree.  She takes meloxicam and Cymbalta and she will wear wrist splints with modest benefit.  She is having discomfort in the hand, not just numbness.  She denies any pain down the arms with turning her head any particular way.  She denies any numbness elsewhere in the body or in the feet, she denies any balance problems or difficulty controlling the bowels or the bladder.  She does report nocturnal myoclonus and she does snore at night.  She is sent to this office for an evaluation.   Past Medical History:  Diagnosis Date  . Anxiety   . Depression   . Stress     Past Surgical History:  Procedure Laterality Date  . CESAREAN SECTION     x2  . FOOT SURGERY     RT foot-"2 titanium screws in my arch"  . MOUTH SURGERY    . TUBAL LIGATION      Family History  Problem Relation Age of Onset  . Hypertension Mother   . Cancer Other   . Thyroid disease Other     Social history:  reports that she has been smoking cigarettes. She has been smoking about 1.00 pack per day. She has never used smokeless tobacco. She reports that she  does not drink alcohol or use drugs.  Medications:  Prior to Admission medications   Medication Sig Start Date End Date Taking? Authorizing Provider  Cyanocobalamin (VITAMIN B 12 PO) Take by mouth.   Yes [provider]  DULoxetine (CYMBALTA) 30 MG capsule Take by mouth daily. 03/30/19  Yes [provider]  EPINEPHrine 0.3 mg/0.3 mL IJ SOAJ injection Inject 0.3 mg into the muscle as needed.   Yes [provider]  levocetirizine (XYZAL) 5 MG tablet Take 5 mg by mouth every evening.   Yes [provider]  meloxicam (MOBIC) 15 MG tablet Take 15 mg by mouth daily. 03/30/19  Yes [provider]  NON FORMULARY CBD OIL   Yes [provider]  phentermine 37.5 MG capsule Take 37.5 mg by mouth every morning.   Yes [provider]  TURMERIC PO Take by mouth.   Yes [provider]      Allergies  Allergen Reactions  . Shellfish Allergy Anaphylaxis    headache  . Ibuprofen     Doesn't work  . Mucinex [Guaifenesin Er] Itching    Patient stated that she can take the DM   . Penicillins Nausea Only    Has patient had a PCN reaction causing immediate rash, facial/tongue/throat swelling, SOB or lightheadedness with hypotension:  No Has patient had a PCN reaction causing severe rash involving mucus membranes or skin necrosis: No Has patient had a PCN reaction that required hospitalization No Has patient had a PCN reaction occurring within the last 10 years: No If all of the above answers are "NO", then may proceed with Cephalosporin use.   . Tobramycin-Dexamethasone     Made eyes swell even worse    ROS:  Out of a complete 14 system review of symptoms, the patient complains only of the following symptoms, and all other reviewed systems are negative.  Numbness in the hands Left shoulder pain  Blood pressure 116/84, pulse (!) 104, temperature 98.2 F (36.8 C), temperature source Temporal, height 5' 7.5" (1.715 m), weight 256 lb  5 oz (116.3 kg), SpO2 99 %.  Physical Exam  General: The patient is alert and cooperative at the time of the examination.  The patient is markedly obese.  Eyes: Pupils are equal, round, and reactive to light. Discs are flat bilaterally.  Neck: The neck is supple, no carotid bruits are noted.  Respiratory: The respiratory examination is clear.  Cardiovascular: The cardiovascular examination reveals a regular rate and rhythm, no obvious murmurs or rubs are noted.  Skin: Extremities are without significant edema.  Neurologic Exam  Mental status: The patient is alert and oriented x 3 at the time of the examination. The patient has apparent normal recent and remote memory, with an apparently normal attention span and concentration ability.  Cranial nerves: Facial symmetry is present. There is good sensation of the face to pinprick and soft touch bilaterally. The strength of the facial muscles and the muscles to head turning and shoulder shrug are normal bilaterally. Speech is well enunciated, no aphasia or dysarthria is noted. Extraocular movements are full. Visual fields are full. The tongue is midline, and the patient has symmetric elevation of the soft palate. No obvious hearing deficits are noted.  Motor: The motor testing reveals 5 over 5 strength of all 4 extremities. Good symmetric motor tone is noted throughout.  Sensory: Sensory testing is intact to pinprick, soft touch, vibration sensation, and position sense on all 4 extremities. No evidence of extinction is noted.  Coordination: Cerebellar testing reveals good finger-nose-finger and heel-to-shin bilaterally.  Tinel sign on the left wrist is positive.  Gait and station: Gait is normal. Tandem gait is normal. Romberg is negative. No drift is seen.  Reflexes: Deep tendon reflexes are symmetric and normal bilaterally. Toes are downgoing bilaterally.   Assessment/Plan:  1.  Bilateral, left greater right hand numbness, possible  carpal tunnel syndrome  The patient will be set up for nerve conduction studies of both arms, EMG on the left arm.  Depending upon the results above, further evaluation may be needed.  Jill Alexanders MD 04/25/2019 4:20 PM  [facility] [address] [place], [place] [address]  Phone [phone] Fax [phone]

## 2019-05-31 ENCOUNTER — Ambulatory Visit: Payer: Medicaid Other | Admitting: Neurology

## 2019-06-08 ENCOUNTER — Other Ambulatory Visit: Payer: Self-pay

## 2019-06-08 ENCOUNTER — Ambulatory Visit: Payer: Medicaid Other | Admitting: Neurology

## 2019-06-08 ENCOUNTER — Encounter: Payer: Self-pay | Admitting: Neurology

## 2019-06-08 ENCOUNTER — Ambulatory Visit (INDEPENDENT_AMBULATORY_CARE_PROVIDER_SITE_OTHER): Payer: Medicaid Other | Admitting: Neurology

## 2019-06-08 DIAGNOSIS — R202 Paresthesia of skin: Secondary | ICD-10-CM

## 2019-06-08 DIAGNOSIS — G5603 Carpal tunnel syndrome, bilateral upper limbs: Secondary | ICD-10-CM

## 2019-06-08 HISTORY — DX: Carpal tunnel syndrome, bilateral upper limbs: G56.03

## 2019-06-08 NOTE — Progress Notes (Signed)
Please refer to EMG and nerve conduction procedure note.  

## 2019-06-08 NOTE — Progress Notes (Addendum)
The patient comes in for EMG and nerve conduction studies today, the study shows bilateral carpal tunnel syndrome.  She has been wearing wrist splint only intermittently.  She reports some clumsiness of the hands, left greater than right.  She is to let me know if and when she needs to see a Copy.     [place]    Nerve / Sites Muscle Latency Ref. Amplitude Ref. Rel Amp Segments Distance Velocity Ref. Area    ms ms mV mV %  cm m/s m/s mVms  R Median - APB     Wrist APB 4.7 ?4.4 7.1 ?4.0 100 Wrist - APB 7   32.0     Upper arm APB 7.9  7.9  111 Upper arm - Wrist 21 67 ?49 35.1  L Median - APB     Wrist APB 5.5 ?4.4 5.5 ?4.0 100 Wrist - APB 7   20.3     Upper arm APB 9.2  6.3  115 Upper arm - Wrist 20 54 ?49 23.5  R Ulnar - ADM     Wrist ADM 2.6 ?3.3 9.0 ?6.0 100 Wrist - ADM 7   32.4     B.Elbow ADM 5.8  6.8  75 B.Elbow - Wrist 17 53 ?49 29.8     A.Elbow ADM 7.9  6.7  99.3 A.Elbow - B.Elbow 10 49 ?49 28.9         A.Elbow - Wrist      L Ulnar - ADM     Wrist ADM 3.1 ?3.3 12.3 ?6.0 100 Wrist - ADM 7   34.2     B.Elbow ADM 5.7  11.8  95.7 B.Elbow - Wrist 17 65 ?49 36.0     A.Elbow ADM 7.7  11.2  94.7 A.Elbow - B.Elbow 10 52 ?49 36.3         A.Elbow - Wrist                 SNC    Nerve / Sites Rec. Site Peak Lat Ref.  Amp Ref. Segments Distance    ms ms V V  cm  R Median - Orthodromic (Dig II, Mid palm)     Dig II Wrist 4.2 ?3.4 8 ?10 Dig II - Wrist 13  L Median - Orthodromic (Dig II, Mid palm)     Dig II Wrist 3.6 ?3.4 1 ?10 Dig II - Wrist 13  R Ulnar - Orthodromic, (Dig V, Mid palm)     Dig V Wrist 2.9 ?3.1 6 ?5 Dig V - Wrist 11  L Ulnar - Orthodromic, (Dig V, Mid palm)     Dig V Wrist 2.8 ?3.1 7 ?5 Dig V - Wrist 50              F  Wave    Nerve F Lat Ref.   ms ms  R Ulnar - ADM 26.9 ?32.0  L Ulnar - ADM 26.9 ?32.0

## 2019-06-08 NOTE — Procedures (Signed)
     HISTORY:  Shannon Parrish is a 43 year old patient with a history of bilateral hand numbness and clumsiness.  The patient is being evaluated for a possible neuropathy or a cervical radiculopathy.  NERVE CONDUCTION STUDIES:  Nerve conduction studies were performed on both upper extremities.  The distal motor latencies for the median nerves were prolonged bilaterally with normal motor amplitudes for these nerves bilaterally.  The distal motor latencies and motor amplitudes for the ulnar nerves were normal bilaterally.  Nerve conduction velocities for the median and ulnar nerves were normal bilaterally.  The sensory latencies for the median nerves were prolonged bilaterally, normal for the ulnar nerves bilaterally.  The F-wave latencies for the ulnar nerves were normal bilaterally.  EMG STUDIES:  EMG study was performed on the left upper extremity:  The first dorsal interosseous muscle reveals 2 to 4 K units with full recruitment. No fibrillations or positive waves were noted. The abductor pollicis brevis muscle reveals 2 to 4 K units with slightly decreased recruitment. No fibrillations or positive waves were noted. The extensor indicis proprius muscle reveals 1 to 3 K units with full recruitment. No fibrillations or positive waves were noted. The pronator teres muscle reveals 2 to 3 K units with full recruitment. No fibrillations or positive waves were noted. The biceps muscle reveals 1 to 2 K units with full recruitment. No fibrillations or positive waves were noted. The triceps muscle reveals 2 to 4 K units with full recruitment. No fibrillations or positive waves were noted. The anterior deltoid muscle reveals 2 to 3 K units with full recruitment. No fibrillations or positive waves were noted. The cervical paraspinal muscles were tested at 2 levels. No abnormalities of insertional activity were seen at either level tested. There was good relaxation.   IMPRESSION:  Nerve conduction  studies done on both upper extremities shows evidence of mild bilateral carpal tunnel syndrome.  EMG evaluation of the left upper extremity does not show evidence of an overlying cervical radiculopathy.  Jill Alexanders MD 06/08/2019 3:15 PM  [facility] [address] [place],  [address]  Phone [phone] Fax [phone]

## 2021-06-06 ENCOUNTER — Ambulatory Visit (INDEPENDENT_AMBULATORY_CARE_PROVIDER_SITE_OTHER): Payer: Self-pay | Admitting: Pulmonary Disease

## 2021-06-06 ENCOUNTER — Encounter: Payer: Self-pay | Admitting: Pulmonary Disease

## 2021-06-06 ENCOUNTER — Other Ambulatory Visit: Payer: Self-pay

## 2021-06-06 VITALS — BP 90/60 | HR 85 | Temp 97.7°F | Ht 66.0 in | Wt 243.4 lb

## 2021-06-06 DIAGNOSIS — G4710 Hypersomnia, unspecified: Secondary | ICD-10-CM

## 2021-06-06 NOTE — Progress Notes (Signed)
Shannon Parrish    [id number]    02-Dec-1975  Primary Care Physician:Vanstory, Suzan Slick, PA  Referring Physician: Norm Salt, PA [address] [place],  [place] [address]  Chief complaint:   Patient being seen for daytime sleepiness  HPI:  Daytime sleepiness Does not feel she has sleep apnea She bilirubinate a lot of nasal stuffiness and congestion  Difficulty falling asleep at rest with CBD will Lost her son about 2 and a half years ago  -Did not get any grief counseling as it was during the height of COVID She suffers from PTSD, depression, anxiety-feels symptoms are well controlled at present  Usually goes to bed between 12 AM and 2 AM Falls asleep at variable times 1-2 awakenings Final wake up time about 9 AM  She does have a history of asthma, sinus fullness and congestion, allergies  She does have 6 pets at home  Sleeps with a fan on all the time Side sleeper  Active smoker History of asthma  Outpatient Encounter Medications as of 06/06/2021  Medication Sig   albuterol (VENTOLIN HFA) 108 (90 Base) MCG/ACT inhaler Inhale into the lungs.   Cyanocobalamin (VITAMIN B 12 PO) Take by mouth.   DULoxetine (CYMBALTA) 30 MG capsule Take by mouth daily.   EPINEPHrine 0.3 mg/0.3 mL IJ SOAJ injection Inject 0.3 mg into the muscle as needed.   fluticasone (FLONASE) 50 MCG/ACT nasal spray Place 1 spray into both nostrils daily.   levocetirizine (XYZAL) 5 MG tablet Take 5 mg by mouth every evening.   meloxicam (MOBIC) 15 MG tablet Take 15 mg by mouth daily.   montelukast (SINGULAIR) 10 MG tablet Take 10 mg by mouth daily.   NON FORMULARY CBD OIL   phentermine 37.5 MG capsule Take 37.5 mg by mouth every morning.   TURMERIC PO Take by mouth.   No facility-administered encounter medications on file as of 06/06/2021.    Allergies as of 06/06/2021 - Review Complete 06/06/2021  Allergen Reaction Noted   Shellfish allergy Anaphylaxis 10/01/2011   Ibuprofen   08/28/2015   Mucinex [guaifenesin er] Itching 02/27/2012   Penicillins Nausea Only 07/26/2011   Tobramycin-dexamethasone  07/26/2011    Past Medical History:  Diagnosis Date   Anxiety    Bilateral carpal tunnel syndrome 06/08/2019   Depression    Stress     Past Surgical History:  Procedure Laterality Date   CESAREAN SECTION     x2   FOOT SURGERY     RT foot-"2 titanium screws in my arch"   MOUTH SURGERY     TUBAL LIGATION      Family History  Problem Relation Age of Onset   Hypertension Mother    Cancer Other    Thyroid disease Other     Social History   Socioeconomic History   Marital status: Single    Spouse name: Not on file   Number of children: 2   Years of education: 12   Highest education level: Not on file  Occupational History   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[employer]  Tobacco Use   Smoking status: Every Day    Packs/day: 1.00    Types: Cigarettes   Smokeless tobacco: Never  Substance and Sexual Activity   Alcohol use: No   Drug use: No   Sexual activity: Not Currently  Other Topics Concern   Not on file  Social History Narrative   Lives with kids and Molly Maduro 1 son passed in  March of 2020   Caffeine use: Drinks Monsters   Right handed   Social Determinants of Health   Financial Resource Strain: Not on file  Food Insecurity: Not on file  Transportation Needs: Not on file  Physical Activity: Not on file  Stress: Not on file  Social Connections: Not on file  Intimate Partner Violence: Not on file    Review of Systems  Constitutional:  Positive for fatigue.  Respiratory:  Negative for shortness of breath.   Psychiatric/Behavioral:  Positive for sleep disturbance.    Vitals:   06/06/21 1627  BP: 90/60  Pulse: 85  Temp: 97.7 F (36.5 C)  SpO2: 99%     Physical Exam Constitutional:      Appearance: She is obese.  HENT:     Head: Normocephalic.     Nose: Nose normal. No congestion.     Mouth/Throat:     Mouth: Mucous membranes are moist.      Comments: Mallampati 3, crowded oropharynx Cardiovascular:     Rate and Rhythm: Normal rate and regular rhythm.     Heart sounds: No murmur heard.   No friction rub.  Pulmonary:     Effort: No respiratory distress.     Breath sounds: No stridor. No wheezing or rhonchi.  Musculoskeletal:     Cervical back: No rigidity or tenderness.  Neurological:     Mental Status: She is alert.  Psychiatric:        Mood and Affect: Mood normal.   No flowsheet data found. Epworth Sleepiness Scale of 14  Data Reviewed: No previous sleep study on record  Assessment:  Excessive daytime sleepiness  Nonrestorative sleep  Active smoker -Has cut down smoking but not quitting yet  Mild asthma -On inhalers  History of PTSD History of depression/anxiety  Significant snoring history, sleep talking  Allergies  Pathophysiology of sleep disordered breathing discussed with the patient Treatment options discussed with the patient  Mild to moderate risk of significant obstructive sleep apnea  Plan/Recommendations: We will schedule patient for an in lab polysomnogram -She does describe multiple awakenings, sometimes difficulty going to sleep -Risk of a falsely negative home sleep study is significant  Behavioral modifications to optimize hours of sleep and sleep hygiene discussed  Importance of weight loss efforts discussed  Tentative follow-up in 3 to 4 months  Encouraged to call with any significant concerns   Virl Diamond MD [facility] 06/06/2021, 4:32 PM  CC: Norm Salt, PA

## 2021-06-06 NOTE — Patient Instructions (Signed)
Schedule you for an in lab polysomnogram  Continue medications to help with nasal stuffiness and congestion  I will see you back in 3 to 4 months   Sleep Apnea Sleep apnea affects breathing during sleep. It causes breathing to stop for 10 seconds or more, or to become shallow. People with sleep apnea usually snore loudly. It can also increase the risk of: Heart attack. Stroke. Being very overweight (obese). Diabetes. Heart failure. Irregular heartbeat. High blood pressure. The goal of treatment is to help you breathe normally again. What are the causes? The most common cause of this condition is a collapsed or blocked airway. There are three kinds of sleep apnea: Obstructive sleep apnea. This is caused by a blocked or collapsed airway. Central sleep apnea. This happens when the brain does not send the right signals to the muscles that control breathing. Mixed sleep apnea. This is a combination of obstructive and central sleep apnea. What increases the risk? Being overweight. Smoking. Having a small airway. Being older. Being female. Drinking alcohol. Taking medicines to calm yourself (sedatives or tranquilizers). Having family members with the condition. Having a tongue or tonsils that are larger than normal. What are the signs or symptoms? Trouble staying asleep. Loud snoring. Headaches in the morning. Waking up gasping. Dry mouth or sore throat in the morning. Being sleepy or tired during the day. If you are sleepy or tired during the day, you may also: Not be able to focus your mind (concentrate). Forget things. Get angry a lot and have mood swings. Feel sad (depressed). Have changes in your personality. Have less interest in sex, if you are female. Be unable to have an erection, if you are female. How is this treated?  Sleeping on your side. Using a medicine to get rid of mucus in your nose (decongestant). Avoiding the use of alcohol, medicines to help you relax,  or certain pain medicines (narcotics). Losing weight, if needed. Changing your diet. Quitting smoking. Using a machine to open your airway while you sleep, such as: An oral appliance. This is a mouthpiece that shifts your lower jaw forward. A CPAP device. This device blows air through a mask when you breathe out (exhale). An EPAP device. This has valves that you put in each nostril. A BPAP device. This device blows air through a mask when you breathe in (inhale) and breathe out. Having surgery if other treatments do not work. Follow these instructions at home: Lifestyle Make changes that your doctor recommends. Eat a healthy diet. Lose weight if needed. Avoid alcohol, medicines to help you relax, and some pain medicines. Do not smoke or use any products that contain nicotine or tobacco. If you need help quitting, ask your doctor. General instructions Take over-the-counter and prescription medicines only as told by your doctor. If you were given a machine to use while you sleep, use it only as told by your doctor. If you are having surgery, make sure to tell your doctor you have sleep apnea. You may need to bring your device with you. Keep all follow-up visits. Contact a doctor if: The machine that you were given to use during sleep bothers you or does not seem to be working. You do not get better. You get worse. Get help right away if: Your chest hurts. You have trouble breathing in enough air. You have an uncomfortable feeling in your back, arms, or stomach. You have trouble talking. One side of your body feels weak. A part of your face  is hanging down. These symptoms may be an emergency. Get help right away. Call your local emergency services (911 in the [place] Do not wait to see if the symptoms will go away. Do not drive yourself to the hospital. Summary This condition affects breathing during sleep. The most common cause is a collapsed or blocked airway. The goal of  treatment is to help you breathe normally while you sleep. This information is not intended to replace advice given to you by your health care provider. Make sure you discuss any questions you have with your health care provider. Document Revised: 08/10/2020 Document Reviewed: 08/10/2020 Elsevier Patient Education  2022 [employer].

## 2021-06-07 ENCOUNTER — Encounter: Payer: Self-pay | Admitting: Pulmonary Disease

## 2021-07-16 ENCOUNTER — Ambulatory Visit (HOSPITAL_BASED_OUTPATIENT_CLINIC_OR_DEPARTMENT_OTHER): Payer: Medicaid Other | Admitting: Pulmonary Disease

## 2021-08-23 ENCOUNTER — Ambulatory Visit (HOSPITAL_BASED_OUTPATIENT_CLINIC_OR_DEPARTMENT_OTHER): Payer: Medicaid Other | Attending: Pulmonary Disease | Admitting: Pulmonary Disease

## 2021-10-08 ENCOUNTER — Other Ambulatory Visit: Payer: Self-pay

## 2021-10-08 ENCOUNTER — Ambulatory Visit (HOSPITAL_BASED_OUTPATIENT_CLINIC_OR_DEPARTMENT_OTHER): Payer: Medicaid Other | Attending: Pulmonary Disease | Admitting: Pulmonary Disease

## 2021-10-08 DIAGNOSIS — G4733 Obstructive sleep apnea (adult) (pediatric): Secondary | ICD-10-CM | POA: Diagnosis present

## 2021-10-08 DIAGNOSIS — G4710 Hypersomnia, unspecified: Secondary | ICD-10-CM

## 2021-10-08 DIAGNOSIS — G4736 Sleep related hypoventilation in conditions classified elsewhere: Secondary | ICD-10-CM | POA: Insufficient documentation

## 2021-10-12 ENCOUNTER — Telehealth: Payer: Self-pay | Admitting: Pulmonary Disease

## 2021-10-12 NOTE — Telephone Encounter (Signed)
Call patient  Sleep study result  Date of study: 10/08/2021  Impression: Mild obstructive sleep apnea Mild oxygen desaturations  Recommendation:  Options of treatment for mild obstructive sleep apnea  CPAP therapy may be considered if patient has significant daytime symptoms that can be ascribed to be an effect of sleep disordered breathing. If CPAP therapy is chosen as option of treatment, auto titrating CPAP with pressure settings of 5-15 will be appropriate  An oral device may also be considered as an option of treatment for mild sleep disordered breathing  Watchful waiting with focus on weight loss and sleep position modification may also be appropriate if not symptomatic.  Close clinical follow up with compliance monitoring to optimize therapeutic efficiency Weight loss measures encouraged  She should be cautioned against driving when sleepy & against medications with sedative side effects  May schedule phone visit to discuss options of treatment prior to moving forward

## 2021-10-12 NOTE — Procedures (Signed)
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 Indications include Depression, Fatigue, Obesity, Parasomnias, Snoring.  MEDICATIONS Patient self administered medications include: FLONASE, ALBUTEROL. Medications administered during study include No sleep medicine administered.  SLEEP STUDY TECHNIQUE A multi-channel overnight Polysomnography study was performed. The channels recorded and monitored were central and occipital EEG, electrooculogram (EOG), submentalis EMG (chin), nasal and oral airflow, thoracic and abdominal wall motion, anterior tibialis EMG, snore microphone, electrocardiogram, and a pulse oximetry. TECHNICIAN COMMENTS Comments added by Technician: Patient had difficulty initiating sleep. Comments added by Scorer: N/A SLEEP ARCHITECTURE The study was initiated at 10:46:08 PM and terminated at 5:07:30 AM. The total recorded time was 381.4 minutes. EEG confirmed total sleep time was 262 minutes yielding a sleep efficiency of 68.7%%. Sleep onset after lights out was 43.2 minutes with a REM latency of N/A minutes. The patient spent 13.2%% of the night in stage N1 sleep, 86.8%% in stage N2 sleep, 0.0%% in stage N3 and 0% in REM. Wake after sleep onset (WASO) was 76.1 minutes. The Arousal Index was 23.6/hour. RESPIRATORY PARAMETERS There were a total of 35 respiratory disturbances out of which 7 were apneas ( 1 obstructive, 0 mixed, 6 central) and 28 hypopneas. The apnea/hypopnea index (AHI) was 8.0 events/hour. The central sleep apnea  index was 1.4 events/hour. The REM AHI was N/A events/hour and NREM AHI was 8.0 events/hour. The supine AHI was 12.8 events/hour and the non supine AHI was 6.6 supine during 23.28% of sleep. Respiratory disturbances were associated with oxygen desaturation down to a nadir of 87.0% during sleep. The mean oxygen saturation during the study was 92.0%. The cumulative time under 88% oxygen saturation was 5.5 minutes.  LEG MOVEMENT DATA The total leg movements were 4 with a resulting leg movement index of 0.9/hr .Associated arousal with leg movement index was 0.0/hr.  CARDIAC DATA The underlying cardiac rhythm was most consistent with sinus rhythm. Mean heart rate during sleep was 89.1 bpm. Additional rhythm abnormalities include PVCs.  IMPRESSIONS - Mild Obstructive Sleep apnea(OSA) - Electrocardiographic data showed presence of PVCs. - Mild Oxygen Desaturation - The patient snored with moderate snoring volume. - No significant periodic leg movements(PLMs) during sleep. However, no significant associated arousals.  DIAGNOSIS - Mild obstructive Sleep Apnea (G47.33) - Nocturnal Hypoxemia (G47.36)  RECOMMENDATIONS - Therapeutic CPAP titration to determine optimal pressure required to alleviate sleep disordered breathing. - Oral device may be considered as an option of treatment - Very mild obstructive sleep apnea. Return to discuss treatment options. - Avoid alcohol, sedatives and other CNS depressants that may worsen sleep apnea and disrupt normal sleep architecture. - Sleep hygiene should be reviewed to assess factors that may improve sleep quality. - Weight management and regular exercise should be initiated or continued.  [Electronically signed] 10/12/2021 11:40 AM  Sherrilyn Rist MD NPI: [id number]

## 2021-10-15 NOTE — Telephone Encounter (Signed)
I called and left a message for the patient to callback for her sleep study results. 

## 2021-10-29 NOTE — Telephone Encounter (Signed)
Closing encounter

## 2021-11-28 ENCOUNTER — Telehealth: Payer: Self-pay | Admitting: Pulmonary Disease

## 2021-11-29 NOTE — Telephone Encounter (Signed)
Called patient back and she would like information on both of the cpap and the oral device so she can talk and think it over for which option best suits her.  ? ?Please advise Dr Jenetta Downer ? ?Is there a web site or anything that we can give her on either option  ?

## 2021-12-02 NOTE — Telephone Encounter (Signed)
Spoke with pt who stats she is seeing her PCP and will discuss options with them and give [place] a call back to schedule OV if needed. Nothing further needed at this time.  ?

## 2021-12-02 NOTE — Telephone Encounter (Signed)
She can do general research on both ? ?Treatment for mild sleep apnea is either with an oral device-need to see a dentist to fashion an oral device ? ?CPAP is the standard treatment for sleep apnea when people have symptoms with it. ? ?offer follow-up appointment ?

## 2022-01-15 ENCOUNTER — Ambulatory Visit
Admission: EM | Admit: 2022-01-15 | Discharge: 2022-01-15 | Disposition: A | Discharge status: Home / Self Care | Payer: Medicaid Other | Attending: Urgent Care | Admitting: Urgent Care

## 2022-01-15 DIAGNOSIS — S86819A Strain of other muscle(s) and tendon(s) at lower leg level, unspecified leg, initial encounter: Secondary | ICD-10-CM | POA: Diagnosis not present

## 2022-01-15 DIAGNOSIS — M79661 Pain in right lower leg: Secondary | ICD-10-CM

## 2022-01-15 DIAGNOSIS — M7989 Other specified soft tissue disorders: Secondary | ICD-10-CM

## 2022-01-15 MED ORDER — TIZANIDINE HCL 4 MG PO TABS: 4.0000 mg | ORAL_TABLET | Freq: Every day | ORAL | 0 refills | Status: DC

## 2022-01-15 NOTE — Discharge Instructions (Signed)
We will be very conservative with your right calf pain.  Wear the cam walker boot and use crutches to move around.  Try to avoid bearing weight on your right lower leg.  Make sure you follow-up with Delbert Harness to get a consultation for definitive diagnosis using an ultrasound or MRI.  If you develop a warm hot sensation of the cath and you will need that ultrasound more emergently to rule out a blood clot.  At this stage you do not have any signs of this and therefore we will hold off on it and manage more for a possible calf strain, calf tear.  Use your meloxicam and Cymbalta as you normally would.  Use tizanidine as a muscle relaxant and try to hydrate very well with 80 ounces of water daily. ?

## 2022-01-15 NOTE — ED Triage Notes (Signed)
Pt c/o "I had a charlie horse earlier," states stepped down a step and felt a pop in right calf and now feels like "internal bleeding" in the calf. Concerned "I ripped the muscle."  ?

## 2022-01-15 NOTE — ED Provider Notes (Signed)
?[facility] ? ? ?MRN: [id number] DOB: [birth year] ? ?Subjective:  ? ?Shannon Parrish is a 46 y.o. female presenting for concern for a torn tendon, muscle of the right calf.  Patient reports that she felt a knot over the medial aspect of her calf and as she was walking felt a pop and sudden swelling, moderate to severe pain.  She has been able to walk but with a significant limp.  No redness, warmth, chest pain, shortness of breath. ? ?No current facility-administered medications for this encounter. ? ?Current Outpatient Medications:  ?  albuterol (VENTOLIN HFA) 108 (90 Base) MCG/ACT inhaler, Inhale into the lungs., Disp: , Rfl:  ?  Cyanocobalamin (VITAMIN B 12 PO), Take by mouth., Disp: , Rfl:  ?  DULoxetine (CYMBALTA) 30 MG capsule, Take by mouth daily., Disp: , Rfl:  ?  EPINEPHrine 0.3 mg/0.3 mL IJ SOAJ injection, Inject 0.3 mg into the muscle as needed., Disp: , Rfl:  ?  fluticasone (FLONASE) 50 MCG/ACT nasal spray, Place 1 spray into both nostrils daily., Disp: , Rfl:  ?  levocetirizine (XYZAL) 5 MG tablet, Take 5 mg by mouth every evening., Disp: , Rfl:  ?  meloxicam (MOBIC) 15 MG tablet, Take 15 mg by mouth daily., Disp: , Rfl:  ?  montelukast (SINGULAIR) 10 MG tablet, Take 10 mg by mouth daily., Disp: , Rfl:  ?  NON FORMULARY, CBD OIL, Disp: , Rfl:  ?  phentermine 37.5 MG capsule, Take 37.5 mg by mouth every morning., Disp: , Rfl:  ?  TURMERIC PO, Take by mouth., Disp: , Rfl:   ? ?Allergies  ?Allergen Reactions  ? Shellfish Allergy Anaphylaxis  ?  headache  ? Ibuprofen   ?  Doesn't work  ? Mucinex [Guaifenesin Er] Itching  ?  Patient stated that she can take the DM   ? Penicillins Nausea Only  ?  Has patient had a PCN reaction causing immediate rash, facial/tongue/throat swelling, SOB or lightheadedness with hypotension: No ?Has patient had a PCN reaction causing severe rash involving mucus membranes or skin necrosis: No ?Has patient had a PCN reaction that required hospitalization No ?Has  patient had a PCN reaction occurring within the last 10 years: No ?If all of the above answers are "NO", then may proceed with Cephalosporin use. ?  ? Tobramycin-Dexamethasone   ?  Made eyes swell even worse  ? ? ?Past Medical History:  ?Diagnosis Date  ? Anxiety   ? Bilateral carpal tunnel syndrome 06/08/2019  ? Depression   ? Stress   ?  ? ?Past Surgical History:  ?Procedure Laterality Date  ? CESAREAN SECTION    ? x2  ? FOOT SURGERY    ? RT foot-"2 titanium screws in my arch"  ? MOUTH SURGERY    ? TUBAL LIGATION    ? ? ?Family History  ?Problem Relation Age of Onset  ? Hypertension Mother   ? Cancer Other   ? Thyroid disease Other   ? ? ?Social History  ? ?Tobacco Use  ? Smoking status: Every Day  ?  Packs/day: 1.00  ?  Types: Cigarettes  ? Smokeless tobacco: Never  ?Substance Use Topics  ? Alcohol use: No  ? Drug use: No  ? ? ?ROS ? ? ?Objective:  ? ?Vitals: ?BP 113/74 (BP Location: Left Arm)   Pulse 92   Temp 97.7 ?F (36.5 ?C) (Oral)   Resp 18   SpO2 97%  ? ?Physical Exam ?Constitutional:   ?  General: She is not in acute distress. ?   Appearance: Normal appearance. She is well-developed. She is not ill-appearing, toxic-appearing or diaphoretic.  ?HENT:  ?   Head: Normocephalic and atraumatic.  ?   Nose: Nose normal.  ?   Mouth/Throat:  ?   Mouth: Mucous membranes are moist.  ?Eyes:  ?   General: No scleral icterus.    ?   Right eye: No discharge.     ?   Left eye: No discharge.  ?   Extraocular Movements: Extraocular movements intact.  ?Cardiovascular:  ?   Rate and Rhythm: Normal rate.  ?Pulmonary:  ?   Effort: Pulmonary effort is normal.  ?Musculoskeletal:  ?   Right lower leg: Swelling (right calf measure 0.5cm larger than the left calf) and tenderness (medial calf) present. No deformity, lacerations or bony tenderness. No edema.  ?   Right ankle: No swelling, deformity, ecchymosis or lacerations. No tenderness. Normal range of motion. Anterior drawer test negative.  ?   Right Achilles Tendon: No  tenderness or defects. Thompson's test negative.  ?   Comments: Negative Homans' sign.  ?Skin: ?   General: Skin is warm and dry.  ?Neurological:  ?   General: No focal deficit present.  ?   Mental Status: She is alert and oriented to person, place, and time.  ?Psychiatric:     ?   Mood and Affect: Mood normal.     ?   Behavior: Behavior normal.     ?   Thought Content: Thought content normal.     ?   Judgment: Judgment normal.  ? ? ?Assessment and Plan :  ? ?PDMP not reviewed this encounter. ? ?1. Strain of calf muscle, initial encounter   ?2. Right calf pain   ?3. Swelling of calf   ? ?Discussed differential which includes calf strain, calf tear, less likely tendon rupture, DVT.  Patient has low risk factors for DVT.  Physical exam findings are consistent with this. Advised that she wear a Cam walker boot, use crutches.  Maintain meloxicam for her pain.  Follow-up with her orthopedist as soon as possible.  Patient declined a note for work. Counseled patient on potential for adverse effects with medications prescribed/recommended today, ER and return-to-clinic precautions discussed, patient verbalized understanding. ? ?  ?Wallis Bamberg, PA-C ?01/16/22 [id number] ? ?

## 2022-01-16 ENCOUNTER — Encounter: Payer: Self-pay | Admitting: Urgent Care

## 2024-01-20 ENCOUNTER — Other Ambulatory Visit: Payer: Self-pay | Admitting: Physician Assistant

## 2024-01-20 DIAGNOSIS — Z1231 Encounter for screening mammogram for malignant neoplasm of breast: Secondary | ICD-10-CM

## 2024-01-27 ENCOUNTER — Ambulatory Visit

## 2024-04-08 ENCOUNTER — Other Ambulatory Visit: Payer: Self-pay

## 2024-04-08 ENCOUNTER — Inpatient Hospital Stay
Admission: EM | Admit: 2024-04-08 | Discharge: 2024-04-11 | DRG: 742 | Disposition: A | Discharge status: Home / Self Care | Attending: Obstetrics and Gynecology | Admitting: Obstetrics and Gynecology

## 2024-04-08 DIAGNOSIS — E669 Obesity, unspecified: Secondary | ICD-10-CM | POA: Diagnosis present

## 2024-04-08 DIAGNOSIS — F17210 Nicotine dependence, cigarettes, uncomplicated: Secondary | ICD-10-CM | POA: Diagnosis present

## 2024-04-08 DIAGNOSIS — Z5941 Food insecurity: Secondary | ICD-10-CM

## 2024-04-08 DIAGNOSIS — Z791 Long term (current) use of non-steroidal anti-inflammatories (NSAID): Secondary | ICD-10-CM

## 2024-04-08 DIAGNOSIS — N939 Abnormal uterine and vaginal bleeding, unspecified: Principal | ICD-10-CM

## 2024-04-08 DIAGNOSIS — D649 Anemia, unspecified: Secondary | ICD-10-CM | POA: Diagnosis present

## 2024-04-08 DIAGNOSIS — D62 Acute posthemorrhagic anemia: Secondary | ICD-10-CM | POA: Diagnosis present

## 2024-04-08 DIAGNOSIS — Z8249 Family history of ischemic heart disease and other diseases of the circulatory system: Secondary | ICD-10-CM

## 2024-04-08 DIAGNOSIS — N898 Other specified noninflammatory disorders of vagina: Secondary | ICD-10-CM | POA: Diagnosis present

## 2024-04-08 DIAGNOSIS — N8312 Corpus luteum cyst of left ovary: Secondary | ICD-10-CM | POA: Diagnosis present

## 2024-04-08 DIAGNOSIS — Z79899 Other long term (current) drug therapy: Secondary | ICD-10-CM

## 2024-04-08 DIAGNOSIS — Z6836 Body mass index (BMI) 36.0-36.9, adult: Secondary | ICD-10-CM

## 2024-04-08 DIAGNOSIS — D259 Leiomyoma of uterus, unspecified: Principal | ICD-10-CM | POA: Diagnosis present

## 2024-04-08 DIAGNOSIS — N920 Excessive and frequent menstruation with regular cycle: Secondary | ICD-10-CM | POA: Diagnosis present

## 2024-04-08 LAB — COMPREHENSIVE METABOLIC PANEL WITH GFR
ALT: 12 U/L (ref 0–44)
AST: 16 U/L (ref 15–41)
Albumin: 3.5 g/dL (ref 3.5–5.0)
Alkaline Phosphatase: 51 U/L (ref 38–126)
Anion gap: 7 (ref 5–15)
BUN: 17 mg/dL (ref 6–20)
CO2: 25 mmol/L (ref 22–32)
Calcium: 8.9 mg/dL (ref 8.9–10.3)
Chloride: 105 mmol/L (ref 98–111)
Creatinine, Ser: 1.01 mg/dL — ABNORMAL HIGH (ref 0.44–1.00)
GFR, Estimated: >60 mL/min (ref >60)
Glucose, Bld: 122 mg/dL — ABNORMAL HIGH (ref 70–99)
Potassium: 3.7 mmol/L (ref 3.5–5.1)
Sodium: 137 mmol/L (ref 135–145)
Total Bilirubin: 0.4 mg/dL (ref 0.0–1.2)
Total Protein: 6.8 g/dL (ref 6.5–8.1)

## 2024-04-08 LAB — CBC
HCT: 19.1 % — ABNORMAL LOW (ref 36.0–46.0)
Hemoglobin: 5.9 g/dL — ABNORMAL LOW (ref 12.0–15.0)
MCH: 24.8 pg — ABNORMAL LOW (ref 26.0–34.0)
MCHC: 30.9 g/dL (ref 30.0–36.0)
MCV: 80.3 fL (ref 80.0–100.0)
Platelets: 386 K/uL (ref 150–400)
RBC: 2.38 MIL/uL — ABNORMAL LOW (ref 3.87–5.11)
RDW: 16 % — ABNORMAL HIGH (ref 11.5–15.5)
WBC: 7.5 K/uL (ref 4.0–10.5)
nRBC: 0 % (ref 0.0–0.2)

## 2024-04-08 LAB — URINALYSIS, ROUTINE W REFLEX MICROSCOPIC
Bacteria, UA: NONE SEEN
Bilirubin Urine: NEGATIVE
Glucose, UA: NEGATIVE mg/dL
Ketones, ur: NEGATIVE mg/dL
Leukocytes,Ua: NEGATIVE
Nitrite: NEGATIVE
Protein, ur: NEGATIVE mg/dL
RBC / HPF: >50 RBC/hpf (ref 0–5)
Specific Gravity, Urine: 1.017 (ref 1.005–1.030)
pH: 5 (ref 5.0–8.0)

## 2024-04-08 LAB — PREGNANCY, URINE: Preg Test, Ur: NEGATIVE

## 2024-04-08 MED ORDER — SODIUM CHLORIDE 0.9 % IV SOLN
10.0000 mL/h | Freq: Once | INTRAVENOUS | Status: AC
  Administered 2024-04-09: 10 mL/h via INTRAVENOUS

## 2024-04-08 NOTE — ED Triage Notes (Signed)
 Patient ambulatory to triage with complaints of increased vaginal bleeding since the 7th. Patient states she is passing a lot of blood and clots. Patient states she feels sluggish and lightheaded. Patient states she is not currently soaking through pads but is passing very large clots and wearing period underwear and tampons.

## 2024-04-08 NOTE — ED Provider Notes (Addendum)
 [facility] Provider Note    Event Date/Time   First MD Initiated Contact with Patient 04/08/24 2323     (approximate)   History   Vaginal Bleeding   HPI  Shannon Parrish is a 48 y.o. female with a history of depression,, anxiety and carpal tunnel syndrome who presents with vaginal bleeding for the last 18 days.  The patient states that originally she had to change a pad every several hours although the rate of bleeding has somewhat subsided.  However she is states that she is passing large clots.  She reports feeling lightheaded and felt like she was about to pass out yesterday although she is not sure if this was due to her Cymbalta  or due to anemia.  She reports generalized weakness and fatigue.  She denies any other abnormal bleeding or bruising.  She denies any abdominal pain or cramping.  I reviewed the past medical records.  The patient's most recent outpatient encounter was at urgent care in 2023 for calf injury.  She has no recent ED visits or hospitalizations.   Physical Exam   Triage Vital Signs: ED Triage Vitals  Encounter Vitals Group     BP 04/08/24 2233 130/69     Girls Systolic BP Percentile --      Girls Diastolic BP Percentile --      Boys Systolic BP Percentile --      Boys Diastolic BP Percentile --      Pulse Rate 04/08/24 2233 (!) 103     Resp 04/08/24 2233 18     Temp 04/08/24 2233 97.8 F (36.6 C)     Temp Source 04/08/24 2233 Oral     SpO2 04/08/24 2233 100 %     Weight 04/08/24 2234 235 lb (106.6 kg)     Height 04/08/24 2234 5' 7 (1.702 m)     Head Circumference --      Peak Flow --      Pain Score --      Pain Loc --      Pain Education --      Exclude from Growth Chart --     Most recent vital signs: Vitals:   04/09/24 0300 04/09/24 0411  BP: (!) 106/57 112/76  Pulse: 92 98  Resp:  17  Temp:  98.1 F (36.7 C)  SpO2: 100% 100%     General: Awake, no distress.  CV:  Good peripheral perfusion.   Resp:  Normal effort.  Abd:  No distention.  Other:  Conjunctival pallor.   ED Results / Procedures / Treatments   Labs (all labs ordered are listed, but only abnormal results are displayed) Labs Reviewed  CBC - Abnormal; Notable for the following components:      Result Value   RBC 2.38 (*)    Hemoglobin 5.9 (*)    HCT 19.1 (*)    MCH 24.8 (*)    RDW 16.0 (*)    All other components within normal limits  COMPREHENSIVE METABOLIC PANEL WITH GFR - Abnormal; Notable for the following components:   Glucose, Bld 122 (*)    Creatinine, Ser 1.01 (*)    All other components within normal limits  URINALYSIS, ROUTINE W REFLEX MICROSCOPIC - Abnormal; Notable for the following components:   Color, Urine YELLOW (*)    APPearance HAZY (*)    Hgb urine dipstick LARGE (*)    All other components within normal limits  PREGNANCY, URINE  PREPARE RBC (CROSSMATCH)  TYPE AND SCREEN  ABO/RH     EKG     RADIOLOGY  US  pelvis: I independently viewed and interpreted the images;    PROCEDURES:  Critical Care performed: No  Procedures   MEDICATIONS ORDERED IN ED: Medications  0.9 %  sodium chloride  infusion (has no administration in time range)  lactated ringers  infusion (has no administration in time range)  ibuprofen  (ADVIL ) tablet 600 mg (has no administration in time range)  oxyCODONE -acetaminophen  (PERCOCET/ROXICET) 5-325 MG per tablet 1-2 tablet (has no administration in time range)  HYDROmorphone  (DILAUDID ) injection 0.2-0.6 mg (has no administration in time range)  zolpidem  (AMBIEN ) tablet 5 mg (has no administration in time range)  docusate sodium  (COLACE) capsule 100 mg (has no administration in time range)  magnesium  hydroxide (MILK OF MAGNESIA) suspension 30 mL (has no administration in time range)  bisacodyl  (DULCOLAX) EC tablet 5 mg (has no administration in time range)  magnesium  citrate solution 1 Bottle (has no administration in time range)  alum & mag  hydroxide-simeth (MAALOX/MYLANTA) 200-200-20 MG/5ML suspension 30 mL (has no administration in time range)  ondansetron  (ZOFRAN ) tablet 4 mg (has no administration in time range)    Or  ondansetron  (ZOFRAN ) injection 4 mg (has no administration in time range)     IMPRESSION / MDM / ASSESSMENT AND PLAN / ED COURSE  I reviewed the triage vital signs and the nursing notes.  48 year old female with PMH as noted above presents with abnormal vaginal bleeding for the last 18 days.  She is not having to change a pad more than a few times per day but does report passing large clots.  She has lightheadedness and fatigue.  On exam her vital signs are normal except for borderline tachycardia.  She does appear pale.  Differential diagnosis includes, but is not limited to, fibroids, perimenopause, dysmenorrhea, other abnormal uterine bleeding.  CBC reveals a hemoglobin of 5.9.  The patient's baseline is normal.  CMP shows no acute findings.  We will transfuse 2 units of PRBCs, obtain an ultrasound, and reassess.  Patient's presentation is most consistent with acute presentation with potential threat to life or bodily function.  The patient is on the cardiac monitor to evaluate for evidence of arrhythmia and/or significant heart rate changes.   ----------------------------------------- 2:02 AM on 04/09/2024 -----------------------------------------  Ultrasound shows large fibroid.  I consulted Dr. Verdon from OB/GYN; based on our discussion she agrees to evaluate the patient for admission.   FINAL CLINICAL IMPRESSION(S) / ED DIAGNOSES   Final diagnoses:  Abnormal uterine bleeding (AUB)  Uterine leiomyoma, unspecified location  Symptomatic anemia     Rx / DC Orders   ED Discharge Orders     None        Note:  This document was prepared using Dragon voice recognition software and may include unintentional dictation errors.    Jacolyn Pae, MD 04/09/24 9560    Jacolyn Pae, MD 04/09/24 [phone]

## 2024-04-09 ENCOUNTER — Encounter: Payer: Self-pay | Admitting: Emergency Medicine

## 2024-04-09 ENCOUNTER — Other Ambulatory Visit: Payer: Self-pay

## 2024-04-09 ENCOUNTER — Emergency Department

## 2024-04-09 DIAGNOSIS — D649 Anemia, unspecified: Secondary | ICD-10-CM | POA: Diagnosis present

## 2024-04-09 LAB — PREPARE RBC (CROSSMATCH)

## 2024-04-09 LAB — ABO/RH: ABO/RH(D): A POS

## 2024-04-09 LAB — HEMOGLOBIN AND HEMATOCRIT, BLOOD
HCT: 23.7 % — ABNORMAL LOW (ref 36.0–46.0)
Hemoglobin: 7.6 g/dL — ABNORMAL LOW (ref 12.0–15.0)

## 2024-04-09 MED ORDER — TRANEXAMIC ACID 650 MG PO TABS
1300.0000 mg | ORAL_TABLET | Freq: Three times a day (TID) | ORAL | 2 refills | Status: AC
  Filled 2024-04-09: qty 30, 5d supply, fill #0

## 2024-04-09 MED ORDER — IBUPROFEN 600 MG PO TABS
600.0000 mg | ORAL_TABLET | Freq: Four times a day (QID) | ORAL | Status: DC | PRN
  Administered 2024-04-11: 600 mg via ORAL
  Filled 2024-04-09: qty 1

## 2024-04-09 MED ORDER — TRANEXAMIC ACID-NACL 1000-0.7 MG/100ML-% IV SOLN
1000.0000 mg | INTRAVENOUS | Status: AC
  Administered 2024-04-09: 1000 mg via INTRAVENOUS
  Filled 2024-04-09: qty 100

## 2024-04-09 MED ORDER — SODIUM CHLORIDE 0.9% IV SOLUTION: Freq: Once | INTRAVENOUS | Status: AC

## 2024-04-09 MED ORDER — MAGNESIUM CITRATE PO SOLN
1.0000 | Freq: Once | ORAL | Status: DC | PRN
Start: 2024-04-09 — End: 2024-04-11

## 2024-04-09 MED ORDER — IRON SUCROSE 300 MG IVPB - SIMPLE MED
300.0000 mg | Freq: Once | Status: AC
  Administered 2024-04-10: 300 mg via INTRAVENOUS
  Filled 2024-04-09: qty 300

## 2024-04-09 MED ORDER — MONTELUKAST SODIUM 10 MG PO TABS
10.0000 mg | ORAL_TABLET | Freq: Every day | ORAL | Status: DC
  Administered 2024-04-09 – 2024-04-10 (×2): 10 mg via ORAL
  Filled 2024-04-09 (×4): qty 1

## 2024-04-09 MED ORDER — DULOXETINE HCL 60 MG PO CPEP
60.0000 mg | ORAL_CAPSULE | Freq: Every day | ORAL | Status: DC
  Administered 2024-04-09 – 2024-04-10 (×2): 60 mg via ORAL
  Filled 2024-04-09 (×3): qty 1

## 2024-04-09 MED ORDER — LACTATED RINGERS IV SOLN: INTRAVENOUS | Status: DC

## 2024-04-09 MED ORDER — BISACODYL 5 MG PO TBEC: 5.0000 mg | DELAYED_RELEASE_TABLET | Freq: Every day | ORAL | Status: DC | PRN

## 2024-04-09 MED ORDER — ONDANSETRON HCL 4 MG/2ML IJ SOLN
4.0000 mg | Freq: Four times a day (QID) | INTRAMUSCULAR | Status: DC | PRN
Start: 2024-04-09 — End: 2024-04-11
  Administered 2024-04-11: 4 mg via INTRAVENOUS

## 2024-04-09 MED ORDER — MAGNESIUM HYDROXIDE 400 MG/5ML PO SUSP: 30.0000 mL | Freq: Every day | ORAL | Status: DC | PRN

## 2024-04-09 MED ORDER — DOCUSATE SODIUM 100 MG PO CAPS
100.0000 mg | ORAL_CAPSULE | Freq: Two times a day (BID) | ORAL | Status: DC
  Filled 2024-04-09: qty 1

## 2024-04-09 MED ORDER — ONDANSETRON HCL 4 MG PO TABS: 4.0000 mg | ORAL_TABLET | Freq: Four times a day (QID) | ORAL | Status: DC | PRN

## 2024-04-09 MED ORDER — ZOLPIDEM TARTRATE 5 MG PO TABS: 5.0000 mg | ORAL_TABLET | Freq: Every evening | ORAL | Status: DC | PRN

## 2024-04-09 MED ORDER — HYDROMORPHONE HCL 1 MG/ML IJ SOLN
0.2000 mg | INTRAMUSCULAR | Status: DC | PRN
  Administered 2024-04-11: 1 mg via INTRAVENOUS

## 2024-04-09 MED ORDER — OXYCODONE-ACETAMINOPHEN 5-325 MG PO TABS: 1.0000 | ORAL_TABLET | ORAL | Status: DC | PRN

## 2024-04-09 MED ORDER — ALUM & MAG HYDROXIDE-SIMETH 200-200-20 MG/5ML PO SUSP
30.0000 mL | ORAL | Status: DC | PRN
Start: 2024-04-09 — End: 2024-04-11

## 2024-04-09 NOTE — Discharge Summary (Shared)
 HD#1 - heavy vaginal bleeding with sx anemia  Subjective: The patient is doing well.  No nausea or vomiting. Pain is adequately controlled.  Objective: Vital signs in last 24 hours: Temp:  [97.8 F (36.6 C)-98.7 F (37.1 C)] 98 F (36.7 C) (07/26 0817) Pulse Rate:  [84-103] 86 (07/26 0817) Resp:  [16-20] 20 (07/26 0817) BP: (90-151)/(56-82) 90/60 (07/26 0817) SpO2:  [95 %-100 %] 100 % (07/26 0817) Weight:  [106.6 kg] 106.6 kg (07/25 2234)  Intake/Output  Intake/Output Summary (Last 24 hours) at 04/09/2024 1239 Last data filed at 04/09/2024 0730 Gross per 24 hour  Intake 654.17 ml  Output --  Net 654.17 ml    Physical Exam:  General: Alert and oriented. CV: RRR Lungs: Clear bilaterally. GI: Soft, Nondistended. Extremities: Nontender, no erythema, no edema.  Lab Results: Recent Labs    04/08/24 2240  HGB 5.9*  HCT 19.1*  WBC 7.5  PLT 386                 Results for orders placed or performed during the hospital encounter of 04/08/24 (from the past 24 hours)  CBC     Status: Abnormal   Collection Time: 04/08/24 10:40 PM  Result Value Ref Range   WBC 7.5 4.0 - 10.5 K/uL   RBC 2.38 (L) 3.87 - 5.11 MIL/uL   Hemoglobin 5.9 (L) 12.0 - 15.0 g/dL   HCT 80.8 (L) 63.9 - 53.9 %   MCV 80.3 80.0 - 100.0 fL   MCH 24.8 (L) 26.0 - 34.0 pg   MCHC 30.9 30.0 - 36.0 g/dL   RDW 83.9 (H) 88.4 - 84.4 %   Platelets 386 150 - 400 K/uL   nRBC 0.0 0.0 - 0.2 %  Comprehensive metabolic panel     Status: Abnormal   Collection Time: 04/08/24 10:40 PM  Result Value Ref Range   Sodium 137 135 - 145 mmol/L   Potassium 3.7 3.5 - 5.1 mmol/L   Chloride 105 98 - 111 mmol/L   CO2 25 22 - 32 mmol/L   Glucose, Bld 122 (H) 70 - 99 mg/dL   BUN 17 6 - 20 mg/dL   Creatinine, Ser 8.98 (H) 0.44 - 1.00 mg/dL   Calcium 8.9 8.9 - 89.6 mg/dL   Total Protein 6.8 6.5 - 8.1 g/dL   Albumin 3.5 3.5 - 5.0 g/dL   AST 16 15 - 41 U/L   ALT 12 0 - 44 U/L   Alkaline Phosphatase 51 38 - 126 U/L   Total  Bilirubin 0.4 0.0 - 1.2 mg/dL   GFR, Estimated >39 >39 mL/min   Anion gap 7 5 - 15  ABO/Rh     Status: None   Collection Time: 04/08/24 10:40 PM  Result Value Ref Range   ABO/RH(D)      A POS Performed at [facility], [address] [place], [place] [address]   Urinalysis, Routine w reflex microscopic -Urine, Clean Catch     Status: Abnormal   Collection Time: 04/08/24 10:56 PM  Result Value Ref Range   Color, Urine YELLOW (A) YELLOW   APPearance HAZY (A) CLEAR   Specific Gravity, Urine 1.017 1.005 - 1.030   pH 5.0 5.0 - 8.0   Glucose, UA NEGATIVE NEGATIVE mg/dL   Hgb urine dipstick LARGE (A) NEGATIVE   Bilirubin Urine NEGATIVE NEGATIVE   Ketones, ur NEGATIVE NEGATIVE mg/dL   Protein, ur NEGATIVE NEGATIVE mg/dL   Nitrite NEGATIVE NEGATIVE   Leukocytes,Ua NEGATIVE NEGATIVE  RBC / HPF >50 0 - 5 RBC/hpf   WBC, UA 6-10 0 - 5 WBC/hpf   Bacteria, UA NONE SEEN NONE SEEN   Squamous Epithelial / HPF 6-10 0 - 5 /HPF   Mucus PRESENT   Pregnancy, urine     Status: None   Collection Time: 04/08/24 10:56 PM  Result Value Ref Range   Preg Test, Ur NEGATIVE NEGATIVE  Prepare RBC (crossmatch)     Status: None   Collection Time: 04/08/24 11:47 PM  Result Value Ref Range   Order Confirmation      ORDER PROCESSED BY BLOOD BANK Performed at [facility], [address] [place], [place] [address]   Type and screen [facility]     Status: None (Preliminary result)   Collection Time: 04/09/24 12:05 AM  Result Value Ref Range   ABO/RH(D) A POS    Antibody Screen NEG    Sample Expiration 04/12/2024,2359    Unit Number [id number]    Blood Component Type RED CELLS,LR    Unit division 00    Status of Unit ISSUED    Transfusion Status OK TO TRANSFUSE    Crossmatch Result Compatible    Unit Number [id number]    Blood Component Type RBC LR PHER2    Unit division 00    Status of Unit ISSUED    Transfusion Status OK TO TRANSFUSE    Crossmatch  Result      Compatible Performed at [facility], [address], [place], [place] [address]     Assessment/Plan:  - Acute on chronic iron  deficiency anemia: s/p 2u pRBCs, s/p TXA - D/c home on Lysteda  po - f/u in office for definitive management plan -Advance diet as tolerated -Discharge home today anticipated ***   Heather Penton, MD   LOS: 0 days   Heather Penton 04/09/2024, 12:39 PM

## 2024-04-09 NOTE — Progress Notes (Signed)
 S:  Shannon Parrish is interested in proceeding with the Hysterectomy. She verbalizes concerns about staying in the hospital, her family and pets rely a lot on her.  She is also a smoker and is concerned she can not go very long without a cigarette. Nicotine patch was offered and Shannon Parrish declined.   O: Blood pressure (!) 108/91, pulse (!) 103, temperature 98.3 F (36.8 C), temperature source Oral, resp. rate 20, height 5' 7 (1.702 m), weight 106.6 kg, last menstrual period 03/21/2024, SpO2 100%.   Results for orders placed or performed during the hospital encounter of 04/08/24 (from the past 24 hours)  CBC     Status: Abnormal   Collection Time: 04/08/24 10:40 PM  Result Value Ref Range   WBC 7.5 4.0 - 10.5 K/uL   RBC 2.38 (L) 3.87 - 5.11 MIL/uL   Hemoglobin 5.9 (L) 12.0 - 15.0 g/dL   HCT 80.8 (L) 63.9 - 53.9 %   MCV 80.3 80.0 - 100.0 fL   MCH 24.8 (L) 26.0 - 34.0 pg   MCHC 30.9 30.0 - 36.0 g/dL   RDW 83.9 (H) 88.4 - 84.4 %   Platelets 386 150 - 400 K/uL   nRBC 0.0 0.0 - 0.2 %  Comprehensive metabolic panel     Status: Abnormal   Collection Time: 04/08/24 10:40 PM  Result Value Ref Range   Sodium 137 135 - 145 mmol/L   Potassium 3.7 3.5 - 5.1 mmol/L   Chloride 105 98 - 111 mmol/L   CO2 25 22 - 32 mmol/L   Glucose, Bld 122 (H) 70 - 99 mg/dL   BUN 17 6 - 20 mg/dL   Creatinine, Ser 8.98 (H) 0.44 - 1.00 mg/dL   Calcium 8.9 8.9 - 89.6 mg/dL   Total Protein 6.8 6.5 - 8.1 g/dL   Albumin 3.5 3.5 - 5.0 g/dL   AST 16 15 - 41 U/L   ALT 12 0 - 44 U/L   Alkaline Phosphatase 51 38 - 126 U/L   Total Bilirubin 0.4 0.0 - 1.2 mg/dL   GFR, Estimated >39 >39 mL/min   Anion gap 7 5 - 15  Hemoglobin and hematocrit, blood     Status: Abnormal   Collection Time: 04/09/24 12:13 PM  Result Value Ref Range   Hemoglobin 7.6 (L) 12.0 - 15.0 g/dL   HCT 76.2 (L) 63.9 - 53.9 %    A:  Shannon Parrish is a 48 y.o. PARA2 who presents with sx anemia, acute on chronic blood loss anemia from fibroid uterus  P: -  Robotic hysterectomy scheduled for Monday (04/11/24) morning - Will continue with 2nd 2u of pRBC tonight in anticipation of the surgery - Will continue with a dose of Venofer  after the pRBCs also in anticipation of the surgery - Regular diet until Sunday night at [facility] Dave Mergen 04/09/2024 5:17 PM

## 2024-04-09 NOTE — Discharge Instructions (Addendum)
 You were seen in the hospital for such heavy bleeding that your blood volume had dropped to about 1/3 of the normal amount. When that happens, you will likely feel very tired, dizzy and sometimes will actually pass out. It is important to keep your blood volume up. (The lab value that we watched is called hemoglobin, and normal is above 12. Yours was 5).  I recommend we talk about ways to keep your uterus from bleeding this much. It will stop at menopause, around 48 years old, but until then you might bleed low again. We can try birth control hormones, an intrauterine device, a hysterectomy. Come to the office and we can talk it through.  We gave you 2 units of blood cells while you were here. We also started a medicine called Lysteda  (or tranexamic acid ), which is not a hormone and you use just when you are bleeding to slow it down until we can figure out something more permanent.   Food Bank Resources    [email or web address]  [employer] [phone]   [employer]  Safe at [facility]  Physical Address: [address] [place] [address]  Mailing address: [address] [place], [place] [address]  [phone]  [email or web address]  Safe at [facility]  Physical Address: [address] [place], [place] [address]  Mailing address: [address] [place], [place] [address]  [email or web address]   Resources for [employer]   [email or web address]    Call 211   Tips For Adding Protein  Patients may be advised to increase the protein in their diet but not necessarily the calories as well. However, note that when adding protein to your diet, you will also be adding extra calories. The following suggestions may help add the extra protein while keeping the calories as low as possible.  Tips Add extra egg to one or more meals  Increase the portion of milk to drink and change to skim milk if able  Include [place] yogurt or cottage cheese for snack or part of a meal  Increase portion size of  protein entre and decrease portion of starch/bread  Mix protein powder, nut butter, almond/nut milk, non-fat dry milk, or Greek yogurt to shakes and smoothies  Use these ingredients also in baked goods or other recipes Use double the amount of sandwich filling  Add protein foods to all snacks including cheese, nut butters, milk and yogurt Food Tips for Including Protein  Beans Cook and use dried peas, beans, and tofu in soups or add to casseroles, pastas, and grain dishes that also contain cheese or meat  Mash with cheese and milk  Use tofu to make smoothies  Commercial Protein Supplements Use nutritional supplements or protein powder sold at pharmacies and grocery stores  Use protein powder in milk drinks and desserts, such as pudding  Mix with ice cream, milk, and fruit or other flavorings for a high-protein milkshake  Cottage Cheese or [employer] Mix with or use to stuff fruits and vegetables  Add to casseroles, spaghetti, noodles, or egg dishes such as omelets, scrambled eggs, and souffls  Use gelatin, pudding-type desserts, cheesecake, and pancake or waffle batter  Use to stuff crepes, pasta shells, or manicotti  Puree and use as a substitute for sour cream  Eggs, Egg whites, and Egg Yolks Add chopped, hard-cooked eggs to salads and dressings, vegetables, casseroles, and creamed meats  Beat eggs into mashed potatoes, vegetable purees, and sauces  Add extra egg whites to quiches, scrambled eggs, custards, puddings, pancake  batter, or [place] toast wash/batter  Make a rich custard with egg yolks, double strength milk, and sugar  Add extra hard-cooked yolks to deviled egg filling and sandwich spreads  Hard or Semi-Soft Cheese (Cheddar, Marinell Kling) Melt on sandwiches, bread, muffins, tortillas, hamburgers, hot dogs, other meats or fish, vegetables, eggs, or desserts such as stewed figs or pies  Grate and add to soups, sauces, casseroles, vegetable dishes, potatoes, rice noodles, or  meatloaf  Serve as a snack with crackers or bagels  Ice cream, Yogurt, and Frozen Yogurt Add to milk drinks such as milkshakes  Add to cereals, fruits, gelatin desserts, and pies  Blend or whip with soft or cooked fruits  Sandwich ice cream or frozen yogurt between enriched cake slices, cookies, or graham crackers  Use seasoned yogurt as a dip for fruits, vegetables, or chips  Use yogurt in place of sour cream in casseroles  Meat and Fish Add chopped, cooked meat or fish to vegetables, salads, casseroles, soups, sauces, and biscuit dough  Use in omelets, souffls, quiches, and sandwich fillings  Add chicken and [place] to stuffing  Wrap in pie crust or biscuit dough as turnovers  Add to stuffed baked potatoes  Add pureed meat to soups  Milk Use in beverages and in cooking  Use in preparing foods, such as hot cereal, soups, cocoa, or pudding  Add cream sauces to vegetable and other dishes  Use evaporated milk, evaporated skim milk, or sweetened condensed milk instead of milk or water in recipes.  Nonfat Dry Milk Add 1/3 cup of nonfat dry milk powdered milk to each cup of regular milk for "double strength" milk  Add to yogurt and milk drinks, such as pasteurized eggnog and milkshakes  Add to scrambled eggs and mashed potatoes  Use in casseroles, meatloaf, hot cereal, breads, muffins, sauces, cream soups, puddings and custards, and other milk-based desserts  Nuts, Seeds, and Wheat Germ Add to casseroles, breads, muffins, pancakes, cookies, and waffles  Sprinkle on fruit, cereal, ice cream, yogurt, vegetables, salads, and toast as a crunchy topping  Use in place of breadcrumbs  Blend with parsley or spinach, herbs, and cream for a noodle, pasta, or vegetable sauce.  Roll banana in chopped nuts  Peanut Butter Spread on sandwiches, toast, muffins, crackers, waffles, pancakes, and fruit slices  Use as a dip for raw vegetables, such as carrots, cauliflower, and celery  Blend with milk drinks,  smoothies, and other beverages  Swirl through soft ice cream or yogurt  Spread on a banana then roll in crushed, dry cereal or chopped nuts   Copyright 2020  Academy of [employer] and Dietetics. All rights reserved.

## 2024-04-09 NOTE — TOC Initial Note (Addendum)
 Transition of Care [facility]) - Initial/Assessment Note    Patient Details  Name: Shannon Parrish MRN: [id number] Date of Birth: November 30, 1975  [facility] CM/SW Contact:    [place] L Madeliene Tejera, LCSW Phone Number: 04/09/2024, 10:51 AM  Clinical Narrative:                    [facility] consult received. Patient has food insecurities. CSW uploaded resources into the AVS for patient.   12:15pm: New consult received advising patient needs resources for Risk analyst. Resources were added to the AVS. Patient can contact 211 for additional resources.      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 [place] [address] Phone: [phone] Fax: [phone]     Social Drivers of Health (SDOH) Social History: SDOH Screenings   Tobacco Use: High Risk (04/09/2024)   SDOH Interventions:     Readmission Risk Interventions     No data to display

## 2024-04-09 NOTE — H&P (Signed)
 Consult History and Physical   SERVICE: Gynecology  Patient Name: Shannon Parrish Patient MRN:   [id number]  CC: Heavy menstrual bleeding  HPI: Shannon Parrish is a 48 y.o. Para2 with sx anemia and heavy bleeding for 18 days, large clots. Filling period underwear, +fatigue and dizziness.  In ED, hgb 5.9. 2u pRBCs given.   Review of Systems: positives in bold GEN:   fevers, chills, weight changes, appetite changes, fatigue, night sweats HEENT:  HA, vision changes, hearing loss, congestion, rhinorrhea, sinus pressure, dysphagia CV:   CP, palpitations PULM:  SOB, cough GI:  abd pain, N/V/D/C GU:  dysuria, urgency, frequency MSK:  arthralgias, myalgias, back pain, swelling SKIN:  rashes, color changes, pallor NEURO:  numbness, weakness, tingling, seizures, dizziness, tremors PSYCH:  depression, anxiety, behavioral problems, confusion  HEME/LYMPH:  easy bruising or bleeding ENDO:  heat/cold intolerance  Past Obstetrical History: OB History   No obstetric history on file.     Past Gynecologic History: Patient's last menstrual period was 03/21/2024. Irregular   Past Medical History: Past Medical History:  Diagnosis Date   Anxiety    Bilateral carpal tunnel syndrome 06/08/2019   Depression    Stress     Past Surgical History:   Past Surgical History:  Procedure Laterality Date   CESAREAN SECTION     x2   FOOT SURGERY     RT foot-2 titanium screws in my arch   MOUTH SURGERY     TUBAL LIGATION      Family History:  family history includes Cancer in an other family member; Hypertension in her mother; Thyroid disease in an other family member.  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[employer]  Tobacco Use   Smoking status: Every Day    Current packs/day:  1.00    Types: Cigarettes   Smokeless tobacco: Never  Substance and Sexual Activity   Alcohol use: No   Drug use: No   Sexual activity: Not Currently  Other Topics Concern   Not on file  Social History Narrative   Lives with kids and Lamar 1 son passed in March of 2020   Caffeine use: Drinks Monsters   Right handed   Social Drivers of Corporate investment banker Strain: Not on file  Food Insecurity: Not on file  Transportation Needs: Not on file  Physical Activity: Not on file  Stress: Not on file  Social Connections: Not on file  Intimate Partner Violence: Not on file    Home Medications:  Medications reconciled in EPIC  No current facility-administered medications on file prior to encounter.   Current Outpatient Medications on File Prior to Encounter  Medication Sig Dispense Refill   Cyanocobalamin (VITAMIN B 12 PO) Take by mouth.     DULoxetine  (CYMBALTA ) 30 MG capsule Take by mouth daily.     EPINEPHrine 0.3 mg/0.3 mL IJ SOAJ injection Inject 0.3 mg into the muscle as needed.     levocetirizine (XYZAL) 5 MG tablet Take 5 mg by mouth every evening.     meloxicam (MOBIC) 15 MG tablet Take 15 mg by mouth daily.     montelukast  (SINGULAIR ) 10 MG tablet Take 10 mg by mouth daily.     albuterol  (VENTOLIN  HFA) 108 (90  Base) MCG/ACT inhaler Inhale into the lungs. (Patient not taking: Reported on 04/09/2024)     fluticasone (FLONASE) 50 MCG/ACT nasal spray Place 1 spray into both nostrils daily. (Patient not taking: Reported on 04/09/2024)     NON FORMULARY CBD OIL     phentermine 37.5 MG capsule Take 37.5 mg by mouth every morning. (Patient not taking: Reported on 04/09/2024)     tiZANidine  (ZANAFLEX ) 4 MG tablet Take 1 tablet (4 mg total) by mouth at bedtime. (Patient not taking: Reported on 04/09/2024) 30 tablet 0   TURMERIC PO Take by mouth.      Allergies:  Allergies  Allergen Reactions   Shellfish Allergy Anaphylaxis    headache   Mucinex [Guaifenesin Er] Itching     Patient stated that she can take the DM    Tobramycin -Dexamethasone      Made eyes swell even worse    Physical Exam:  Temp:  [97.8 F (36.6 C)-98.7 F (37.1 C)] 98 F (36.7 C) (07/26 0817) Pulse Rate:  [84-103] 86 (07/26 0817) Resp:  [16-20] 20 (07/26 0817) BP: (90-151)/(56-82) 90/60 (07/26 0817) SpO2:  [95 %-100 %] 100 % (07/26 0817) Weight:  [106.6 kg] 106.6 kg (07/25 2234)   General Appearance:  Well developed, well nourished, no acute distress, alert and oriented x3 HEENT:  Normocephalic atraumatic, extraocular movements intact, moist mucous membranes Cardiovascular:  Normal S1/S2, regular rate and rhythm, no murmurs Pulmonary:  clear to auscultation, no wheezes, rales or rhonchi, symmetric air entry, good air exchange Abdomen:  Bowel sounds present, soft, nontender, nondistended, no abnormal masses, no epigastric pain Extremities:  Full range of motion, no pedal edema, 2+ distal pulses, no tenderness Skin:  normal coloration and turgor, no rashes, no suspicious skin lesions noted  Neurologic:  Cranial nerves 2-12 grossly intact, normal muscle tone, strength 5/5 all four extremities Psychiatric:  Normal mood and affect, appropriate, no AH/VH Pelvic: deferred  Labs/Studies:   CBC and Coags:  Lab Results  Component Value Date   WBC 7.5 04/08/2024   NEUTOPHILPCT 64 11/09/2013   EOSPCT 3 11/09/2013   BASOPCT 1 11/09/2013   LYMPHOPCT 26 11/09/2013   HGB 5.9 (L) 04/08/2024   HCT 19.1 (L) 04/08/2024   MCV 80.3 04/08/2024   PLT 386 04/08/2024   CMP:  Lab Results  Component Value Date   NA 137 04/08/2024   K 3.7 04/08/2024   CL 105 04/08/2024   CO2 25 04/08/2024   BUN 17 04/08/2024   CREATININE 1.01 (H) 04/08/2024   CREATININE 0.85 01/07/2015   CREATININE 0.83 11/09/2013   PROT 6.8 04/08/2024   BILITOT 0.4 04/08/2024   ALT 12 04/08/2024   AST 16 04/08/2024   ALKPHOS 51 04/08/2024    Other Imaging: US  Pelvis Complete Result Date: 04/09/2024 CLINICAL DATA:   Abnormal uterine bleeding EXAM: TRANSABDOMINAL ULTRASOUND OF PELVIS TECHNIQUE: Transabdominal ultrasound examination of the pelvis was performed including evaluation of the uterus, ovaries, adnexal regions, and pelvic cul-de-sac. COMPARISON:  None Available. FINDINGS: Uterus Measurements: 11.2 x 7.0 x 6.8 cm. = volume: 282 mL. A 5.4 cm posterior fundal fibroid is seen. Endometrium Thickness: 5.1 mm. The endometrium is somewhat obscured by the fundal fibroid. Right ovary Measurements: 3.3 x 1.9 x 2.1 cm. = volume: 6.8 mL. Normal appearance/no adnexal mass. Left ovary Measurements: 3.5 x 2.3 x 2.3 cm. = volume: 6.2 mL. Normal appearance/no adnexal mass. Other findings:  No abnormal free fluid. IMPRESSION: Large posterior uterine fibroid in the fundus. This is likely the etiology of  the abnormal uterine bleeding. Electronically Signed   By: Oneil Devonshire M.D.   On: 04/09/2024 00:32     Assessment / Plan:   Shannon Parrish is a 48 y.o. No obstetric history on file. who presents with sx anemia, acute on chronic blood loss anemia from fibroid uterus.  - 2u pRBCs  - repeat CBC in 4hrs - discussion of iv estrogen, Combined OCP taper , IUD placed in hospital. She declines hormones at this time. - we discussed hysterectomy in the future if stable and if she desires. I will discuss with her in the office, as I feel she is concerned about people she left at home and does feel improved with the 2u pRBCs enough she would like to be d/c today. However, ppx management would be appropriate, and I will send her with txa po for as needed until I see her.  - TOC placed for food insecurity - meds to beds for d/c TXA meds - SCDs

## 2024-04-10 ENCOUNTER — Other Ambulatory Visit: Payer: Self-pay

## 2024-04-10 DIAGNOSIS — D649 Anemia, unspecified: Secondary | ICD-10-CM | POA: Diagnosis present

## 2024-04-10 DIAGNOSIS — N939 Abnormal uterine and vaginal bleeding, unspecified: Secondary | ICD-10-CM | POA: Diagnosis present

## 2024-04-10 DIAGNOSIS — Z5941 Food insecurity: Secondary | ICD-10-CM | POA: Diagnosis not present

## 2024-04-10 DIAGNOSIS — E669 Obesity, unspecified: Secondary | ICD-10-CM | POA: Diagnosis present

## 2024-04-10 DIAGNOSIS — N8312 Corpus luteum cyst of left ovary: Secondary | ICD-10-CM | POA: Diagnosis present

## 2024-04-10 DIAGNOSIS — N898 Other specified noninflammatory disorders of vagina: Secondary | ICD-10-CM | POA: Diagnosis present

## 2024-04-10 DIAGNOSIS — Z6836 Body mass index (BMI) 36.0-36.9, adult: Secondary | ICD-10-CM | POA: Diagnosis not present

## 2024-04-10 DIAGNOSIS — F17210 Nicotine dependence, cigarettes, uncomplicated: Secondary | ICD-10-CM | POA: Diagnosis present

## 2024-04-10 DIAGNOSIS — Z8249 Family history of ischemic heart disease and other diseases of the circulatory system: Secondary | ICD-10-CM | POA: Diagnosis not present

## 2024-04-10 DIAGNOSIS — N920 Excessive and frequent menstruation with regular cycle: Secondary | ICD-10-CM | POA: Diagnosis present

## 2024-04-10 DIAGNOSIS — D259 Leiomyoma of uterus, unspecified: Secondary | ICD-10-CM | POA: Diagnosis present

## 2024-04-10 DIAGNOSIS — D62 Acute posthemorrhagic anemia: Secondary | ICD-10-CM | POA: Diagnosis present

## 2024-04-10 DIAGNOSIS — Z791 Long term (current) use of non-steroidal anti-inflammatories (NSAID): Secondary | ICD-10-CM | POA: Diagnosis not present

## 2024-04-10 DIAGNOSIS — Z79899 Other long term (current) drug therapy: Secondary | ICD-10-CM | POA: Diagnosis not present

## 2024-04-10 LAB — COMPREHENSIVE METABOLIC PANEL WITH GFR
ALT: 10 U/L (ref 0–44)
AST: 17 U/L (ref 15–41)
Albumin: 3 g/dL — ABNORMAL LOW (ref 3.5–5.0)
Alkaline Phosphatase: 53 U/L (ref 38–126)
Anion gap: 10 (ref 5–15)
BUN: 13 mg/dL (ref 6–20)
CO2: 23 mmol/L (ref 22–32)
Calcium: 8.5 mg/dL — ABNORMAL LOW (ref 8.9–10.3)
Chloride: 104 mmol/L (ref 98–111)
Creatinine, Ser: 0.68 mg/dL (ref 0.44–1.00)
GFR, Estimated: >60 mL/min (ref >60)
Glucose, Bld: 106 mg/dL — ABNORMAL HIGH (ref 70–99)
Potassium: 4.3 mmol/L (ref 3.5–5.1)
Sodium: 137 mmol/L (ref 135–145)
Total Bilirubin: 1.9 mg/dL — ABNORMAL HIGH (ref 0.0–1.2)
Total Protein: 5.9 g/dL — ABNORMAL LOW (ref 6.5–8.1)

## 2024-04-10 LAB — TYPE AND SCREEN
ABO/RH(D): A POS
Antibody Screen: NEGATIVE
Unit division: 0
Unit division: 0
Unit division: 0
Unit division: 0

## 2024-04-10 LAB — BPAM RBC
Blood Product Expiration Date: 202507292359
Blood Product Expiration Date: 202508252359
Blood Product Expiration Date: 202508252359
Blood Product Expiration Date: 202508262359
ISSUE DATE / TIME: 202507260126
ISSUE DATE / TIME: 202507260510
ISSUE DATE / TIME: 202507261951
ISSUE DATE / TIME: 202507262234
Unit Type and Rh: 600
Unit Type and Rh: 6200
Unit Type and Rh: 6200
Unit Type and Rh: 6200

## 2024-04-10 LAB — CBC
HCT: 29.4 % — ABNORMAL LOW (ref 36.0–46.0)
Hemoglobin: 9.4 g/dL — ABNORMAL LOW (ref 12.0–15.0)
MCH: 26.1 pg (ref 26.0–34.0)
MCHC: 32 g/dL (ref 30.0–36.0)
MCV: 81.7 fL (ref 80.0–100.0)
Platelets: 342 K/uL (ref 150–400)
RBC: 3.6 MIL/uL — ABNORMAL LOW (ref 3.87–5.11)
RDW: 16.3 % — ABNORMAL HIGH (ref 11.5–15.5)
WBC: 6.5 K/uL (ref 4.0–10.5)
nRBC: 0 % (ref 0.0–0.2)

## 2024-04-10 MED ORDER — SODIUM CHLORIDE 0.9 % IV SOLN: INTRAVENOUS | Status: DC | PRN

## 2024-04-10 MED ORDER — FLUCONAZOLE 100 MG PO TABS
100.0000 mg | ORAL_TABLET | Freq: Every day | ORAL | Status: DC
  Administered 2024-04-10: 100 mg via ORAL
  Filled 2024-04-10 (×2): qty 1

## 2024-04-10 MED ORDER — ADULT MULTIVITAMIN W/MINERALS CH
1.0000 | ORAL_TABLET | Freq: Every day | ORAL | Status: DC
  Administered 2024-04-10: 1 via ORAL
  Filled 2024-04-10 (×2): qty 1

## 2024-04-10 MED ORDER — ENSURE PLUS HIGH PROTEIN PO LIQD
237.0000 mL | Freq: Two times a day (BID) | ORAL | Status: DC
  Administered 2024-04-10 (×2): 237 mL via ORAL

## 2024-04-10 NOTE — Progress Notes (Signed)
 Obstetric and Gynecology  Subjective  Shannon Parrish is a 48 y.o. female G2P2002 who presented on 04/08/2024 for profound anemia with hgb of 5.9 from heavy uterine bleeding .  5cm posterior fibroid, 11cm uterus  Is a current smoker  Bleeding resolved. S/p txa x1 dose. Feeling improved significantly and looking forward to definitive management.  Objective   Vitals:   04/10/24 0303 04/10/24 0834  BP: 129/72 127/82  Pulse: 71 79  Resp: 18 20  Temp: 98.1 F (36.7 C) 97.8 F (36.6 C)  SpO2: 98% 100%     Intake/Output Summary (Last 24 hours) at 04/10/2024 1413 Last data filed at 04/10/2024 0515 Gross per 24 hour  Intake 1288.8 ml  Output --  Net 1288.8 ml    General: NAD Cardiovascular: RRR, no murmurs Pulmonary: CTAB Abdomen: Benign. Non-tender, +BS, no guarding. Extremities: No erythema or cords, no calf tenderness, +warmth with normal peripheral pulses.  Labs: Results for orders placed or performed during the hospital encounter of 04/08/24 (from the past 24 hours)  Comprehensive metabolic panel     Status: Abnormal   Collection Time: 04/10/24  6:42 AM  Result Value Ref Range   Sodium 137 135 - 145 mmol/L   Potassium 4.3 3.5 - 5.1 mmol/L   Chloride 104 98 - 111 mmol/L   CO2 23 22 - 32 mmol/L   Glucose, Bld 106 (H) 70 - 99 mg/dL   BUN 13 6 - 20 mg/dL   Creatinine, Ser 9.31 0.44 - 1.00 mg/dL   Calcium 8.5 (L) 8.9 - 10.3 mg/dL   Total Protein 5.9 (L) 6.5 - 8.1 g/dL   Albumin 3.0 (L) 3.5 - 5.0 g/dL   AST 17 15 - 41 U/L   ALT 10 0 - 44 U/L   Alkaline Phosphatase 53 38 - 126 U/L   Total Bilirubin 1.9 (H) 0.0 - 1.2 mg/dL   GFR, Estimated >39 >39 mL/min   Anion gap 10 5 - 15  CBC     Status: Abnormal   Collection Time: 04/10/24  6:42 AM  Result Value Ref Range   WBC 6.5 4.0 - 10.5 K/uL   RBC 3.60 (L) 3.87 - 5.11 MIL/uL   Hemoglobin 9.4 (L) 12.0 - 15.0 g/dL   HCT 70.5 (L) 63.9 - 53.9 %   MCV 81.7 80.0 - 100.0 fL   MCH 26.1 26.0 - 34.0 pg   MCHC 32.0 30.0 - 36.0  g/dL   RDW 83.6 (H) 88.4 - 84.4 %   Platelets 342 150 - 400 K/uL   nRBC 0.0 0.0 - 0.2 %    Cultures: No results found for this or any previous visit.  Imaging: US  Pelvis Complete Result Date: 04/09/2024 CLINICAL DATA:  Abnormal uterine bleeding EXAM: TRANSABDOMINAL ULTRASOUND OF PELVIS TECHNIQUE: Transabdominal ultrasound examination of the pelvis was performed including evaluation of the uterus, ovaries, adnexal regions, and pelvic cul-de-sac. COMPARISON:  None Available. FINDINGS: Uterus Measurements: 11.2 x 7.0 x 6.8 cm. = volume: 282 mL. A 5.4 cm posterior fundal fibroid is seen. Endometrium Thickness: 5.1 mm. The endometrium is somewhat obscured by the fundal fibroid. Right ovary Measurements: 3.3 x 1.9 x 2.1 cm. = volume: 6.8 mL. Normal appearance/no adnexal mass. Left ovary Measurements: 3.5 x 2.3 x 2.3 cm. = volume: 6.2 mL. Normal appearance/no adnexal mass. Other findings:  No abnormal free fluid. IMPRESSION: Large posterior uterine fibroid in the fundus. This is likely the etiology of the abnormal uterine bleeding. Electronically Signed   By: Oneil Devonshire  M.D.   On: 04/09/2024 00:32     Assessment   48 y.o. [facility] Day: 3   Plan   1. ROBOTICALLY-ASSISTED TOTAL LAPAROSCOPIC HYSTERECTOMY, BS with cystoscopy possible tomorrow. NPO after midnight 2. Now s/p 4u pRBCs with appropriate clinical rise. S/p venofer . - two prior c/s, will plan to backfill if needed - excite technique to removal - stable now  Consented today, including r/b/a. She would like to move ahead during this hospitalization. Aware of pelvic rest limitations. Desires minimal pain meds.

## 2024-04-10 NOTE — Progress Notes (Signed)
 Initial Nutrition Assessment  DOCUMENTATION CODES:   Obesity unspecified  INTERVENTION:   -Continue regular diet -Ensure Plus High Protein po BID, each supplement provides 350 kcal and 20 grams of protein  -MVI with minerals -RD provided Tips for Adding Protein from [facility] Nutrition Care Manual; attached to AVS/ discharge summary  NUTRITION DIAGNOSIS:   Increased nutrient needs related to post-op healing as evidenced by estimated needs.  GOAL:   Patient will meet greater than or equal to 90% of their needs  MONITOR:   PO intake, Supplement acceptance  REASON FOR ASSESSMENT:   Consult Assessment of nutrition requirement/status  ASSESSMENT:   Pt presents with sx anemia, acute on chronic blood loss anemia from fibroid uterus.  Pt admitted with heavy menstrual bleeding.   Reviewed I/O's: +809 ml x 24 hours and +1.9 L since admission  Pt unavailable at time of visit. Attempted to speak with pt via call to hospital room phone, however, unable to reach. RD unable to obtain further nutrition-related history or complete nutrition-focused physical exam at this time.    Pt currently on a regular diet. Noted meal completions 100%.   RD consulted for increased protein intake. Noted plans for robotic hysterectomy on 04/11/24.   Wt has been stable over the past several years.   TOC evaluated pt for food insecurity; resources provided.   Medications reviewed and include colace and diflucan .  Labs reviewed.    Diet Order:   Diet Order             Diet regular Fluid consistency: Thin  Diet effective now                   EDUCATION NEEDS:   No education needs have been identified at this time  Skin:  Skin Assessment: Reviewed RN Assessment  Last BM:  04/09/24  Height:   Ht Readings from Last 1 Encounters:  04/08/24 5' 7 (1.702 m)    Weight:   Wt Readings from Last 1 Encounters:  04/08/24 106.6 kg    Ideal Body Weight:  61.4 kg  BMI:  Body mass index  is 36.81 kg/m.  Estimated Nutritional Needs:   Kcal:  1850-2050  Protein:  100-115 grams  Fluid:  1.8-2.0 L   Margery ORN, RD, LDN, CDCES Registered Dietitian III Certified Diabetes Care and Education Specialist If unable to reach this RD, please use RD Inpatient group chat on secure chat between hours of 8am-4 pm daily

## 2024-04-11 ENCOUNTER — Encounter: Payer: Self-pay | Admitting: Obstetrics and Gynecology

## 2024-04-11 ENCOUNTER — Inpatient Hospital Stay: Payer: Self-pay | Admitting: Anesthesiology

## 2024-04-11 ENCOUNTER — Other Ambulatory Visit: Payer: Self-pay

## 2024-04-11 ENCOUNTER — Encounter: Payer: Self-pay | Admitting: Anesthesiology

## 2024-04-11 ENCOUNTER — Encounter
Admission: EM | Disposition: A | Discharge status: Home / Self Care | Payer: Self-pay | Source: Home / Self Care | Attending: Obstetrics and Gynecology

## 2024-04-11 HISTORY — PX: CYSTOSCOPY: SHX5120

## 2024-04-11 HISTORY — PX: HYSTERECTOMY, TOTAL, LAPAROSCOPIC, ROBOT-ASSISTED WITH SALPINGECTOMY: SHX7587

## 2024-04-11 LAB — TYPE AND SCREEN
ABO/RH(D): A POS
Antibody Screen: NEGATIVE

## 2024-04-11 SURGERY — HYSTERECTOMY, TOTAL, LAPAROSCOPIC, ROBOT-ASSISTED WITH SALPINGECTOMY
Anesthesia: General | Site: Bladder

## 2024-04-11 MED ORDER — CELECOXIB 200 MG PO CAPS
400.0000 mg | ORAL_CAPSULE | ORAL | Status: AC
  Administered 2024-04-11: 400 mg via ORAL

## 2024-04-11 MED ORDER — METHYLENE BLUE (ANTIDOTE) 1 % IV SOLN
INTRAVENOUS | Status: AC
  Filled 2024-04-11: qty 10

## 2024-04-11 MED ORDER — LIDOCAINE HCL (PF) 2 % IJ SOLN
INTRAMUSCULAR | Status: AC
  Filled 2024-04-11: qty 5

## 2024-04-11 MED ORDER — OXYCODONE HCL 5 MG/5ML PO SOLN: 5.0000 mg | Freq: Once | ORAL | Status: DC | PRN

## 2024-04-11 MED ORDER — ALBUTEROL SULFATE HFA 108 (90 BASE) MCG/ACT IN AERS
INHALATION_SPRAY | RESPIRATORY_TRACT | Status: AC
  Filled 2024-04-11: qty 6.7

## 2024-04-11 MED ORDER — PHENYLEPHRINE 80 MCG/ML (10ML) SYRINGE FOR IV PUSH (FOR BLOOD PRESSURE SUPPORT)
PREFILLED_SYRINGE | INTRAVENOUS | Status: DC | PRN
  Administered 2024-04-11 (×2): 160 ug via INTRAVENOUS
  Administered 2024-04-11: 240 ug via INTRAVENOUS
  Administered 2024-04-11: 160 ug via INTRAVENOUS
  Administered 2024-04-11: 80 ug via INTRAVENOUS
  Administered 2024-04-11: 160 ug via INTRAVENOUS

## 2024-04-11 MED ORDER — MIDAZOLAM HCL 2 MG/2ML IJ SOLN
INTRAMUSCULAR | Status: AC
  Filled 2024-04-11: qty 2

## 2024-04-11 MED ORDER — HEMOSTATIC AGENTS (NO CHARGE) OPTIME
TOPICAL | Status: DC | PRN
  Administered 2024-04-11: 1 via TOPICAL

## 2024-04-11 MED ORDER — PHENYLEPHRINE 80 MCG/ML (10ML) SYRINGE FOR IV PUSH (FOR BLOOD PRESSURE SUPPORT)
PREFILLED_SYRINGE | INTRAVENOUS | Status: AC
Start: 2024-04-11 — End: 2024-04-11
  Filled 2024-04-11: qty 10

## 2024-04-11 MED ORDER — LIDOCAINE HCL (CARDIAC) PF 100 MG/5ML IV SOSY
PREFILLED_SYRINGE | INTRAVENOUS | Status: DC | PRN
  Administered 2024-04-11: 100 mg via INTRAVENOUS

## 2024-04-11 MED ORDER — ONDANSETRON HCL 4 MG/2ML IJ SOLN
INTRAMUSCULAR | Status: AC
  Filled 2024-04-11: qty 2

## 2024-04-11 MED ORDER — ALBUTEROL SULFATE HFA 108 (90 BASE) MCG/ACT IN AERS
INHALATION_SPRAY | RESPIRATORY_TRACT | Status: DC | PRN
  Administered 2024-04-11: 5 via RESPIRATORY_TRACT

## 2024-04-11 MED ORDER — FENTANYL CITRATE (PF) 100 MCG/2ML IJ SOLN
25.0000 ug | INTRAMUSCULAR | Status: DC | PRN
  Administered 2024-04-11 (×2): 25 ug via INTRAVENOUS

## 2024-04-11 MED ORDER — ROCURONIUM BROMIDE 10 MG/ML (PF) SYRINGE
PREFILLED_SYRINGE | INTRAVENOUS | Status: AC
  Filled 2024-04-11: qty 10

## 2024-04-11 MED ORDER — KETOROLAC TROMETHAMINE 30 MG/ML IJ SOLN
INTRAMUSCULAR | Status: DC | PRN
  Administered 2024-04-11: 30 mg via INTRAVENOUS

## 2024-04-11 MED ORDER — ACETAMINOPHEN 10 MG/ML IV SOLN: 1000.0000 mg | Freq: Once | INTRAVENOUS | Status: DC | PRN

## 2024-04-11 MED ORDER — SODIUM CHLORIDE 0.9 % IR SOLN
Status: DC | PRN
Start: 2024-04-11 — End: 2024-04-11
  Administered 2024-04-11: 1000 mL
  Administered 2024-04-11: 3000 mL via INTRAVESICAL

## 2024-04-11 MED ORDER — OXYCODONE HCL 5 MG PO TABS
5.0000 mg | ORAL_TABLET | ORAL | 0 refills | Status: AC | PRN
  Filled 2024-04-11: qty 4, 1d supply, fill #0

## 2024-04-11 MED ORDER — POVIDONE-IODINE 10 % EX SWAB: 2.0000 | Freq: Once | CUTANEOUS | Status: DC

## 2024-04-11 MED ORDER — FENTANYL CITRATE (PF) 100 MCG/2ML IJ SOLN
INTRAMUSCULAR | Status: DC | PRN
  Administered 2024-04-11: 100 ug via INTRAVENOUS

## 2024-04-11 MED ORDER — MIDAZOLAM HCL 2 MG/2ML IJ SOLN
INTRAMUSCULAR | Status: DC | PRN
  Administered 2024-04-11: 2 mg via INTRAVENOUS

## 2024-04-11 MED ORDER — CELECOXIB 200 MG PO CAPS
ORAL_CAPSULE | ORAL | Status: AC
Start: 2024-04-11 — End: 2024-04-11
  Filled 2024-04-11: qty 2

## 2024-04-11 MED ORDER — ACETAMINOPHEN EXTRA STRENGTH 500 MG PO TABS
1000.0000 mg | ORAL_TABLET | Freq: Four times a day (QID) | ORAL | 0 refills | Status: AC
  Filled 2024-04-11: qty 24, 3d supply, fill #0

## 2024-04-11 MED ORDER — OXYCODONE HCL 5 MG PO TABS: 5.0000 mg | ORAL_TABLET | Freq: Once | ORAL | Status: DC | PRN

## 2024-04-11 MED ORDER — METHYLENE BLUE (ANTIDOTE) 1 % IV SOLN
INTRAVENOUS | Status: DC | PRN
  Administered 2024-04-11: 5 mL

## 2024-04-11 MED ORDER — EPHEDRINE SULFATE-NACL 50-0.9 MG/10ML-% IV SOSY
PREFILLED_SYRINGE | INTRAVENOUS | Status: DC | PRN
  Administered 2024-04-11: 10 mg via INTRAVENOUS

## 2024-04-11 MED ORDER — METRONIDAZOLE 500 MG/100ML IV SOLN
500.0000 mg | INTRAVENOUS | Status: AC
  Administered 2024-04-11: 500 mg via INTRAVENOUS
  Filled 2024-04-11 (×2): qty 100

## 2024-04-11 MED ORDER — BUPIVACAINE HCL (PF) 0.5 % IJ SOLN
INTRAMUSCULAR | Status: AC
  Filled 2024-04-11: qty 30

## 2024-04-11 MED ORDER — CEFAZOLIN SODIUM-DEXTROSE 2-4 GM/100ML-% IV SOLN
2.0000 g | INTRAVENOUS | Status: AC
  Administered 2024-04-11: 2 g via INTRAVENOUS

## 2024-04-11 MED ORDER — LACTATED RINGERS IV SOLN: INTRAVENOUS | Status: DC

## 2024-04-11 MED ORDER — PROPOFOL 10 MG/ML IV BOLUS
INTRAVENOUS | Status: AC
  Filled 2024-04-11: qty 40

## 2024-04-11 MED ORDER — FENTANYL CITRATE (PF) 100 MCG/2ML IJ SOLN
INTRAMUSCULAR | Status: AC
  Filled 2024-04-11: qty 2

## 2024-04-11 MED ORDER — BUPIVACAINE HCL (PF) 0.5 % IJ SOLN
INTRAMUSCULAR | Status: DC | PRN
  Administered 2024-04-11: 14 mL

## 2024-04-11 MED ORDER — ERYTHROMYCIN 5 MG/GM OP OINT
TOPICAL_OINTMENT | Freq: Four times a day (QID) | OPHTHALMIC | Status: DC
  Administered 2024-04-11: 1 via OPHTHALMIC
  Filled 2024-04-11: qty 1

## 2024-04-11 MED ORDER — ONDANSETRON 4 MG PO TBDP
4.0000 mg | ORAL_TABLET | Freq: Three times a day (TID) | ORAL | 0 refills | Status: AC | PRN
  Filled 2024-04-11: qty 20, 7d supply, fill #0

## 2024-04-11 MED ORDER — ACETAMINOPHEN 500 MG PO TABS
1000.0000 mg | ORAL_TABLET | ORAL | Status: AC
  Administered 2024-04-11: 1000 mg via ORAL

## 2024-04-11 MED ORDER — ACETAMINOPHEN 500 MG PO TABS
ORAL_TABLET | ORAL | Status: AC
  Filled 2024-04-11: qty 2

## 2024-04-11 MED ORDER — GABAPENTIN 300 MG PO CAPS
300.0000 mg | ORAL_CAPSULE | Freq: Every day | ORAL | 0 refills | Status: AC
  Filled 2024-04-11: qty 14, 14d supply, fill #0

## 2024-04-11 MED ORDER — KETOROLAC TROMETHAMINE 0.5 % OP SOLN
1.0000 [drp] | Freq: Four times a day (QID) | OPHTHALMIC | Status: DC
  Administered 2024-04-11: 1 [drp] via OPHTHALMIC
  Filled 2024-04-11: qty 3

## 2024-04-11 MED ORDER — TRANEXAMIC ACID-NACL 1000-0.7 MG/100ML-% IV SOLN
1000.0000 mg | Freq: Once | INTRAVENOUS | Status: AC
  Administered 2024-04-11: 1000 mg via INTRAVENOUS

## 2024-04-11 MED ORDER — BSS IO SOLN
15.0000 mL | Freq: Once | INTRAOCULAR | Status: DC
  Filled 2024-04-11: qty 15

## 2024-04-11 MED ORDER — PROPOFOL 10 MG/ML IV BOLUS
INTRAVENOUS | Status: DC | PRN
  Administered 2024-04-11: 200 mg via INTRAVENOUS

## 2024-04-11 MED ORDER — EPHEDRINE 5 MG/ML INJ
INTRAVENOUS | Status: AC
  Filled 2024-04-11: qty 5

## 2024-04-11 MED ORDER — CELECOXIB 200 MG PO CAPS
200.0000 mg | ORAL_CAPSULE | Freq: Two times a day (BID) | ORAL | 0 refills | Status: AC
  Filled 2024-04-11: qty 6, 3d supply, fill #0

## 2024-04-11 MED ORDER — HYDROMORPHONE HCL 1 MG/ML IJ SOLN
INTRAMUSCULAR | Status: AC
Start: 2024-04-11 — End: 2024-04-11
  Filled 2024-04-11: qty 1

## 2024-04-11 MED ORDER — ROCURONIUM BROMIDE 100 MG/10ML IV SOLN
INTRAVENOUS | Status: DC | PRN
  Administered 2024-04-11 (×2): 10 mg via INTRAVENOUS
  Administered 2024-04-11: 50 mg via INTRAVENOUS
  Administered 2024-04-11: 40 mg via INTRAVENOUS
  Administered 2024-04-11: 30 mg via INTRAVENOUS

## 2024-04-11 MED ORDER — CEFAZOLIN SODIUM-DEXTROSE 2-4 GM/100ML-% IV SOLN
INTRAVENOUS | Status: AC
  Filled 2024-04-11: qty 100

## 2024-04-11 MED ORDER — DEXAMETHASONE SODIUM PHOSPHATE 10 MG/ML IJ SOLN
INTRAMUSCULAR | Status: AC
Start: 2024-04-11 — End: 2024-04-11
  Filled 2024-04-11: qty 1

## 2024-04-11 MED ORDER — FENTANYL CITRATE (PF) 100 MCG/2ML IJ SOLN
INTRAMUSCULAR | Status: AC
Start: 2024-04-11 — End: 2024-04-11
  Filled 2024-04-11: qty 2

## 2024-04-11 MED ORDER — KETOROLAC TROMETHAMINE 30 MG/ML IJ SOLN
INTRAMUSCULAR | Status: AC
  Filled 2024-04-11: qty 1

## 2024-04-11 MED ORDER — SUGAMMADEX SODIUM 200 MG/2ML IV SOLN
INTRAVENOUS | Status: DC | PRN
  Administered 2024-04-11: 200 mg via INTRAVENOUS

## 2024-04-11 MED ORDER — DEXAMETHASONE SODIUM PHOSPHATE 10 MG/ML IJ SOLN
INTRAMUSCULAR | Status: DC | PRN
  Administered 2024-04-11: 10 mg via INTRAVENOUS

## 2024-04-11 MED ORDER — TRANEXAMIC ACID-NACL 1000-0.7 MG/100ML-% IV SOLN
INTRAVENOUS | Status: AC
  Filled 2024-04-11: qty 100

## 2024-04-11 MED ORDER — 0.9 % SODIUM CHLORIDE (POUR BTL) OPTIME
TOPICAL | Status: DC | PRN
  Administered 2024-04-11: 500 mL

## 2024-04-11 SURGICAL SUPPLY — 51 items
BAG URINE DRAIN 2000ML AR STRL (UROLOGICAL SUPPLIES) ×3 IMPLANT
BLADE SURG SZ11 CARB STEEL (BLADE) ×3 IMPLANT
CANNULA CAP OBTURATR AIRSEAL 8 (CAP) ×3 IMPLANT
CATH URTH 16FR FL 2W BLN LF (CATHETERS) ×3 IMPLANT
COVER TIP SHEARS 8 DVNC (MISCELLANEOUS) ×3 IMPLANT
DERMABOND ADVANCED .7 DNX12 (GAUZE/BANDAGES/DRESSINGS) ×3 IMPLANT
DRAPE ARM DVNC X/XI (DISPOSABLE) ×9 IMPLANT
DRAPE COLUMN DVNC XI (DISPOSABLE) ×3 IMPLANT
DRAPE ROBOT W/ LEGGING 30X125 (DRAPES) ×1 IMPLANT
DRIVER NDL MEGA 8 DVNC XI (INSTRUMENTS) ×2 IMPLANT
DRIVER NDLE MEGA DVNC XI (INSTRUMENTS) ×3 IMPLANT
ELECTRODE REM PT RTRN 9FT ADLT (ELECTROSURGICAL) ×3 IMPLANT
FORCEPS BPLR FENES DVNC XI (FORCEP) ×3 IMPLANT
GAUZE 4X4 16PLY ~~LOC~~+RFID DBL (SPONGE) ×3 IMPLANT
GLOVE BIO SURGEON STRL SZ7 (GLOVE) ×12 IMPLANT
GLOVE BIOGEL PI IND STRL 7.5 (GLOVE) ×12 IMPLANT
GOWN STRL REUS W/ TWL LRG LVL3 (GOWN DISPOSABLE) ×9 IMPLANT
IRRIGATION STRYKERFLOW (MISCELLANEOUS) IMPLANT
IRRIGATOR SUCT 8 DISP DVNC XI (IRRIGATION / IRRIGATOR) ×1 IMPLANT
IV LACTATED RINGERS 1000ML (IV SOLUTION) ×1 IMPLANT
IV NS 1000ML BAXH (IV SOLUTION) IMPLANT
KIT PINK PAD W/HEAD ARM REST (MISCELLANEOUS) ×3 IMPLANT
LABEL OR SOLS (LABEL) ×3 IMPLANT
MANIFOLD NEPTUNE II (INSTRUMENTS) ×3 IMPLANT
MANIPULATOR VCARE LG CRV RETR (MISCELLANEOUS) IMPLANT
MANIPULATOR VCARE SML CRV RETR (MISCELLANEOUS) IMPLANT
MANIPULATOR VCARE STD CRV RETR (MISCELLANEOUS) ×1 IMPLANT
NDL 21 GA WING INFUSION (NEEDLE) IMPLANT
NEEDLE 21 GA WING INFUSION (NEEDLE) IMPLANT
OBTURATOR OPTICALSTD 8 DVNC (TROCAR) ×3 IMPLANT
OCCLUDER COLPOPNEUMO (BALLOONS) ×3 IMPLANT
PACK GYN LAPAROSCOPIC (MISCELLANEOUS) ×3 IMPLANT
PAD PREP OB/GYN DISP 24X41 (PERSONAL CARE ITEMS) ×3 IMPLANT
POWDER SURGICEL 3.0 GRAM (HEMOSTASIS) ×1 IMPLANT
SCISSORS MNPLR CVD DVNC XI (INSTRUMENTS) ×3 IMPLANT
SCRUB CHG 4% DYNA-HEX 4OZ (MISCELLANEOUS) ×3 IMPLANT
SEAL UNIV 5-12 XI (MISCELLANEOUS) ×6 IMPLANT
SEALER VESSEL EXT DVNC XI (MISCELLANEOUS) ×1 IMPLANT
SET CYSTO W/LG BORE CLAMP LF (SET/KITS/TRAYS/PACK) IMPLANT
SET TUBE FILTERED XL AIRSEAL (SET/KITS/TRAYS/PACK) ×3 IMPLANT
SOL .9 NS 3000ML IRR UROMATIC (IV SOLUTION) ×1 IMPLANT
SOLUTION ELECTROSURG ANTI STCK (MISCELLANEOUS) ×3 IMPLANT
SOLUTION PREP PVP 2OZ (MISCELLANEOUS) ×3 IMPLANT
SURGILUBE 2OZ TUBE FLIPTOP (MISCELLANEOUS) ×3 IMPLANT
SUT STRATA PDS 0 30 CT-2.5 (SUTURE) ×3 IMPLANT
SUT VIC AB 2-0 SH 27XBRD (SUTURE) ×1 IMPLANT
SUTURE MNCRL 4-0 27XMF (SUTURE) ×4 IMPLANT
SYR 50ML LL SCALE MARK (SYRINGE) ×3 IMPLANT
TIP ENDOSCOPIC SURGICEL (TIP) ×1 IMPLANT
TUBING ART PRESS 48 MALE/FEM (TUBING) IMPLANT
WATER STERILE IRR 500ML POUR (IV SOLUTION) ×3 IMPLANT

## 2024-04-11 NOTE — TOC Transition Note (Signed)
 Transition of Care [facility]) - Discharge Note   Patient Details  Name: Shannon Parrish MRN: [id number] Date of Birth: 01-03-1976  [facility]) CM/SW Contact:  Lauraine JAYSON Carpen, LCSW Phone Number: 04/11/2024, 12:41 PM   Clinical Narrative:   Patient has orders to discharge home today. No further concerns. CSW signing off.  Final next level of care: Home/Self Care Barriers to Discharge: Barriers Resolved   Patient Goals and CMS Choice            Discharge Placement                    Patient and family notified of of transfer: 04/11/24  Discharge Plan and Services Additional resources added to the After Visit Summary for                                       Social Drivers of Health (SDOH) Interventions SDOH Screenings   Food Insecurity: Food Insecurity Present (04/09/2024)  Housing: High Risk (04/09/2024)  Transportation Needs: No Transportation Needs (04/09/2024)  Utilities: At Risk (04/09/2024)  Tobacco Use: High Risk (04/09/2024)     Readmission Risk Interventions     No data to display

## 2024-04-11 NOTE — Anesthesia Postprocedure Evaluation (Signed)
 Anesthesia Post Note  Patient: Shannon Parrish  Procedure(s) Performed: HYSTERECTOMY, SUPRACERVICAL, ROBOT-ASSISTED, LAPAROSCOPIC, WITH BILATERAL SALPINGECTOMY, LEFT OOPHORECTOMY, REPAIR OF HYMENAL LACERATION (Abdomen) CYSTOSCOPY (Bladder)  Patient location during evaluation: PACU Anesthesia Type: General Level of consciousness: awake and alert Pain management: pain level controlled Vital Signs Assessment: post-procedure vital signs reviewed and stable Respiratory status: spontaneous breathing, nonlabored ventilation, respiratory function stable and patient connected to nasal cannula oxygen Cardiovascular status: blood pressure returned to baseline and stable Postop Assessment: no apparent nausea or vomiting Anesthetic complications: no   No notable events documented.   Last Vitals:  Vitals:   04/11/24 1300 04/11/24 1315  BP: 131/73 123/72  Pulse: 94 92  Resp: 14 (!) 24  Temp:    SpO2: 95% 90%    Last Pain:  Vitals:   04/11/24 1245  TempSrc:   PainSc: Asleep                 Lynwood KANDICE Clause

## 2024-04-11 NOTE — Transfer of Care (Signed)
 Immediate Anesthesia Transfer of Care Note  Patient: Shannon Parrish  Procedure(s) Performed: HYSTERECTOMY, SUPRACERVICAL, ROBOT-ASSISTED, LAPAROSCOPIC, WITH BILATERAL SALPINGECTOMY, LEFT OOPHORECTOMY, REPAIR OF HYMENAL LACERATION (Abdomen) CYSTOSCOPY (Bladder)  Patient Location: PACU  Anesthesia Type:General  Level of Consciousness: sedated  Airway & Oxygen Therapy: Patient Spontanous Breathing and Patient connected to face mask oxygen  Post-op Assessment: Report given to RN and Post -op Vital signs reviewed and stable  Post vital signs: Reviewed and stable  Last Vitals:  Vitals Value Taken Time  BP 118/79 04/11/24 12:06  Temp    Pulse 91 04/11/24 12:08  Resp 14 04/11/24 12:08  SpO2 96 % 04/11/24 12:08  Vitals shown include unfiled device data.  Last Pain:  Vitals:   04/11/24 0719  TempSrc:   PainSc: 0-No pain         Complications: No notable events documented.

## 2024-04-11 NOTE — Anesthesia Procedure Notes (Signed)
 Procedure Name: Intubation Date/Time: 04/11/2024 7:48 AM  Performed by: Veronica Alm BROCKS, CRNAPre-anesthesia Checklist: Patient identified, Patient being monitored, Timeout performed, Emergency Drugs available and Suction available Patient Re-evaluated:Patient Re-evaluated prior to induction Oxygen Delivery Method: Circle system utilized Preoxygenation: Pre-oxygenation with 100% oxygen Induction Type: IV induction Ventilation: Mask ventilation without difficulty Laryngoscope Size: 3 and McGrath Grade View: Grade I Tube type: Oral Tube size: 7.5 mm Number of attempts: 1 Airway Equipment and Method: Stylet Placement Confirmation: ETT inserted through vocal cords under direct vision, positive ETCO2 and breath sounds checked- equal and bilateral Secured at: 22 cm Tube secured with: Tape Dental Injury: Teeth and Oropharynx as per pre-operative assessment

## 2024-04-11 NOTE — Progress Notes (Signed)
 All discharge instructions given to pt at 2210; pt's ride (her son) is here; pt very happy about being able to go home tonight; RN educated pt about make her 2 follow up appointments tomorrow when she's at home (please call and make eye appointment at [facility] for this Wednesday and make a 2 week follow up with dr. Verdon); 2 saline locks removed; pt discharged via wheelchair escorted by RN; pt going home with her son

## 2024-04-11 NOTE — Anesthesia Preprocedure Evaluation (Signed)
 Anesthesia Evaluation  Patient identified by MRN, date of birth, ID band Patient awake    Reviewed: Allergy & Precautions, NPO status , Patient's Chart, lab work & pertinent test results  History of Anesthesia Complications Negative for: history of anesthetic complications  Airway Mallampati: IV   Neck ROM: Full    Dental  (+) Edentulous Upper, Edentulous Lower   Pulmonary Current Smoker (1-1.5 ppd) and Patient abstained from smoking.   Pulmonary exam normal breath sounds clear to auscultation       Cardiovascular Exercise Tolerance: Good negative cardio ROS Normal cardiovascular exam Rhythm:Regular Rate:Normal     Neuro/Psych  Headaches PSYCHIATRIC DISORDERS Anxiety Depression       GI/Hepatic negative GI ROS,,,  Endo/Other  Obesity   Renal/GU negative Renal ROS     Musculoskeletal   Abdominal   Peds  Hematology  (+) Blood dyscrasia, anemia   Anesthesia Other Findings   Reproductive/Obstetrics                              Anesthesia Physical Anesthesia Plan  ASA: 2  Anesthesia Plan: General   Post-op Pain Management:    Induction: Intravenous  PONV Risk Score and Plan: 2 and Ondansetron , Dexamethasone  and Treatment may vary due to age or medical condition  Airway Management Planned: Oral ETT  Additional Equipment:   Intra-op Plan:   Post-operative Plan: Extubation in OR  Informed Consent: I have reviewed the patients History and Physical, chart, labs and discussed the procedure including the risks, benefits and alternatives for the proposed anesthesia with the patient or authorized representative who has indicated his/her understanding and acceptance.     Dental advisory given  Plan Discussed with: CRNA  Anesthesia Plan Comments: (Patient consented for risks of anesthesia including but not limited to:  - adverse reactions to medications - damage to eyes, teeth,  lips or other oral mucosa - nerve damage due to positioning  - sore throat or hoarseness - damage to heart, brain, nerves, lungs, other parts of body or loss of life  Informed patient about role of CRNA in peri- and intra-operative care.  Patient voiced understanding.)        Anesthesia Quick Evaluation

## 2024-04-11 NOTE — Progress Notes (Signed)
   11:54 AM   Shannon Parrish [birth year] [id number]  Contacted by Dr. Verdon regarding cystoscopy findings after robotic hysterectomy.  On my arrival to the OR patient was intubated and abdominal incisions have been closed, Dr. Verdon had a cystoscope in the bladder.  There were 2 subtle and small areas at the posterior bladder wall, <1cm each, maroon to dark-colored consistent with bruising/small hematoma.  There is no evidence of laceration, bladder was otherwise grossly normal, ureteral orifices orthotopic with efflux of yellow urine.  Bladder distended normally.  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  We discussed options like cystogram or catheter placement and ultimately deferred based on the small size of the area and low suspicion for bladder injury.  [name], MD  [facility] [address], [address] [place], [place] [address] [phone]

## 2024-04-11 NOTE — Op Note (Addendum)
 Shannon Parrish PROCEDURE DATE: 04/11/2024  PREOPERATIVE DIAGNOSIS: Abnormal uterine bleeding-fibroids POSTOPERATIVE DIAGNOSIS: The same PROCEDURE:  HYSTERECTOMY, ROBOT-ASSISTED, TOTAL LAPAROSCOPIC, WITH BILATERAL SALPINGECTOMY, LEFT OOPHORECTOMY, REPAIR OF HYMENAL LACERATION:   CYSTOSCOPY: 52000 (CPT) Lysis of adhesions Repair of hymenal tear  SURGEON:  Dr. Heather Penton, MD ASSISTANT: Dr. Beverli Dinsmore, MD and Bobbette Brunswick, CNM  Anesthesiologist:  Anesthesiologist: Myra Lynwood MATSU, MD; Shellie Odor, MD CRNA: Delores Evalene BROCKS, CRNA; Veronica Alm BROCKS, CRNA  INDICATIONS: 48 y.o. F  here for definitive surgical management secondary to the indications listed under preoperative diagnoses; please see preoperative note for further details.  She was admitted over the weekend with severe symptomatic anemia, with an admitting hemoglobin of 5.9.  Ultrasound found a large posterior fibroid and an enlarged fibroid uterus.  She received 4 units of packed red cells, IV iron , and TXA.   Risks of surgery were discussed with the patient including but not limited to: bleeding which may require transfusion or reoperation; infection which may require antibiotics; injury to bowel, bladder, ureters or other surrounding organs; need for additional procedures; thromboembolic phenomenon, incisional problems and other postoperative/anesthesia complications. Written informed consent was obtained.    FINDINGS:    External genitalia, vaginal canal and cervix negative for lesions. Intraoperative findings revealed a normal upper abdomen including bowel, liver, diaphragmatic surfaces, stomach, and omentum.   Midline omental adhesions to the anterior abdominal wall about 4 cm inferior to the umbilicus.  The uterus was small and mobile.  The right and left ovaries appeared normal.  The left ovary was on tension on the IP, with a large corpus luteum cyst.  At the end of the case, the left ovary was  bleeding from the cyst and it was unable to be controlled.  There was also tension enough that the ovary retracted into the left lower quadrant under the left sigmoid and lateral to it.  For this reason, the left ovary was removed to prevent retroperitoneal scarring of the ovary outside of the pelvis and possible pain from the tension.  OOphopexy was considered and discarded because of the anatomy on tension.  Bilateral tubes appeared disrupted from prior bilateral tubal ligation.  Appendix was unable to be visualized. Cystoscopy at the end end of the case noted small hematomas in the trigone.  There were 3 of these.  Bilateral ureters were patent with excellent E flux.   ANESTHESIA:    General INTRAVENOUS FLUIDS:1300  ml ESTIMATED BLOOD LOSS:100 ml URINE OUTPUT: 450 ml  SPECIMENS: Uterus, cervix, bilateral fallopian tubes and left ovary COMPLICATIONS: None immediate   RATLH/BS:  PROCEDURE IN DETAIL: After informed consent was obtained, the patient was taken to the operating room where general anesthesia was obtained without difficulty. The patient was positioned in the dorsal lithotomy position in [place] stirrups and her arms were carefully tucked at her sides and the usual precautions were taken. Deep Trendelenburg (20-25 deg) was established to confirm that she does not shift on the table.  She was prepped and draped in normal sterile fashion.  Time-out was performed and a Foley catheter was placed into the bladder. A standard VCare uterine manipulator was then placed in the uterus without incident.  Preoperative prophylactic antibiotics were given through her iv.  After infiltration of local anesthetic at the proposed trocar sites, an 8 mm incision was created at the umbilicus, and an AirSeal 5mm was placed under direct visualization, after confirmation of OG tube working well. Pneumoperitoneum was created to a pressure of 15  mm Hg. The camera was placed and the abdomin surveyed, noting  intact bowel below the site of entry. A survey of the pelvis and upper abdomen revealed the above findings. One right and one left lateral 8-mm robotic ports were placed under direct visualization.  The patient was placed in deepTrendelenburg and the bowel was displaced up into the upper abdomen. The robot was left side docked. The instruments were placed under direct visualization.   The ureters were identified bilaterally coursing outside of the operative field.   Initially, the omental adhesions were taken down carefully.  There was significant adhesions between the bladder and the lower uterine segment, particularly on the left side.A bladder flap was created and the bladder was dissected down off the lower uterine segment and cervix using endoshears and electrocautery.  The bladder was backfilled with the 100 mL of methylene blue  stained fluid, to be certain of bladder integrity.  Round ligaments were divided on each side with the EndoShears and the retroperitoneal space was opened bilaterally. The posterior leaflet of the broad was taken down to the level of the IP ligament. The anterior leaflet of the broad ligament was carefully taken down to the midline.    The Fallopian tubes were divided from the ovaries, and care taken to hemostatically transect the utero-ovarian ligament. The peritoneum was taken down to the level of the internal os, and the uterine arteries skeletonized. With strong cephalad pressure from the V-care, bipolar cautery was used to seal and transect the uterine arteries, and the pedicles allowed to fall away laterally.  A colpotomy was performed circumferentially along the V-Care ring with monopolar electrocautery and the cervix was incised from the vagina using the laparoscopic scissors. The specimen was bivalved, and removed through the vagina.  A pneumo balloon was placed in the vagina and the vaginal cuff was then closed in a running continuous fashion using the  0 V-Lock  suture with careful attention to include the vaginal cuff angles, the uterosacral ligaments and the vaginal mucosa within the closure.  A second layer was imbricated across.  Hemostasis was secured with intraabdominal pressure and review of all surgical sites.  The left bleeding ovary was removed with careful cautery across the IP ligament, being careful to avoid the sigmoid colon and the ureter on the left side.  The intraperitoneal pressure was dropped, and all planes of dissection, vascular pedicles and the vaginal cuff were found to be hemostatic.  Granular Surgicel was placed over the areas, with excess removed.  The robot was undocked.   Attention was turned to the bladder and cystoscopy showed vigorous bilateral ureteral jets.  No stitches were visualized in the bladder during cystoscopy.  Several areas of bruising were noted, and urology was brought in.  After examination, we decided not to leave a Foley catheter in place because of the superficial nature of the bruising and no need for biopsy.  The lateral trocars were removed under visualization.  The CO2 gas was released and several deep breaths given to remove any remaining CO2 from the peritoneal cavity.  The skin incisions were closed with 4-0 Monocryl subcuticular stitch and Dermabond.   Survey of the cuff and vagina noted small hymenal tear that was bleeding. A single figure of eight with 2-0 vicryl was placed for hemostasis.   Anesthesia was reversed without difficulty.  The patient tolerated the procedure well.  Sponge, lap and needle counts were correct x2.  The patient was taken to recovery room in excellent  condition.

## 2024-04-11 NOTE — Progress Notes (Signed)
 To O.R. via bed and transported by [facility].

## 2024-04-11 NOTE — Interval H&P Note (Signed)
 History and Physical Interval Note:  04/11/2024 7:35 AM  Shannon Parrish  has presented today for surgery, with the diagnosis of n/a.  The various methods of treatment have been discussed with the patient and family. After consideration of risks, benefits and other options for treatment, the patient has consented to  Procedure(s): HYSTERECTOMY, SUPRACERVICAL, ROBOT-ASSISTED, LAPAROSCOPIC, WITH BILATERAL SALPINGECTOMY (N/A) as a surgical intervention.  The patient's history has been reviewed, patient examined, no change in status, stable for surgery.  I have reviewed the patient's chart and labs.  Questions were answered to the patient's satisfaction.     Heather Penton

## 2024-04-12 ENCOUNTER — Encounter: Payer: Self-pay | Admitting: Obstetrics and Gynecology

## 2024-04-12 ENCOUNTER — Other Ambulatory Visit: Payer: Self-pay

## 2024-04-12 LAB — SURGICAL PATHOLOGY

## 2024-04-12 NOTE — Discharge Summary (Signed)
 1 Day Post-Op       Procedure(s): HYSTERECTOMY, SUPRACERVICAL, ROBOT-ASSISTED, LAPAROSCOPIC, WITH BILATERAL SALPINGECTOMY, LEFT OOPHORECTOMY, REPAIR OF HYMENAL LACERATION (N/A) CYSTOSCOPY Subjective: The patient is doing well.  No nausea or vomiting. Pain is adequately controlled.   48 y.o. female G2P2002 who presented on 04/08/2024 for profound anemia with hgb of 5.9 from heavy uterine bleeding .   5cm posterior fibroid, 11cm uterus  She received 4u pRBCs, TXA and iv Iron . On 04/11/24 she underwent ROBOTICALLY-ASSISTED TOTAL LAPAROSCOPIC HYSTERECTOMY, BS and Lysis of adhesions with cystoscopy and repair small hymenal tear.  Postoperatively she did well surgically, but experienced a small corneal abrasion. She was given ophthalmic toradol  and erythromycin  eye drops for discharge. Plan for f/u in 48 hrs with ophthalmology.  She had several hours of low )2 sats managed with [place] 2L.  She was discharged home the same evening.  Objective: Vital signs in last 24 hours: Temp:  [96.8 F (36 C)-98.4 F (36.9 C)] 98.4 F (36.9 C) (07/28 2210) Pulse Rate:  [84-96] 95 (07/28 1950) Resp:  [12-24] 18 (07/28 1950) BP: (112-141)/(68-91) 112/71 (07/28 1950) SpO2:  [89 %-98 %] 95 % (07/28 1950)  Intake/Output  Intake/Output Summary (Last 24 hours) at 04/12/2024 0918 Last data filed at 04/11/2024 2200 Gross per 24 hour  Intake 1200 ml  Output 1525 ml  Net -325 ml    Physical Exam:  General: Alert and oriented. CV: RRR Lungs: Clear bilaterally. GI: Soft, Nondistended. Incisions: Clean and dry.  Extremities: Nontender, no erythema, no edema.  Lab Results: Recent Labs    04/09/24 1213 04/10/24 0642  HGB 7.6* 9.4*  HCT 23.7* 29.4*  WBC  --  6.5  PLT  --  342                 Results for orders placed or performed during the hospital encounter of 04/08/24 (from the past 24 hours)  Blood transfusion report - scanned     Status: None ()   Collection Time: 04/11/24 11:53 AM   Narrative    Ordered by an unspecified provider.  Blood transfusion report - scanned     Status: None   Collection Time: 04/11/24 11:53 AM   Narrative   Ordered by an unspecified provider.  Type and screen [facility] Only draw if prior T&S expired     Status: None   Collection Time: 04/11/24 12:14 PM  Result Value Ref Range   ABO/RH(D) A POS    Antibody Screen NEG    Sample Expiration      04/14/2024,2359 Performed at [facility], [address] [place], [place] [address]     Assessment/Plan: 1 Day Post-Op       Procedure(s): HYSTERECTOMY, SUPRACERVICAL, ROBOT-ASSISTED, LAPAROSCOPIC, WITH BILATERAL SALPINGECTOMY, LEFT OOPHORECTOMY, REPAIR OF HYMENAL LACERATION (N/A) CYSTOSCOPY  Allergies as of 04/11/2024       Reactions   Shellfish Allergy Anaphylaxis   headache   Mucinex [guaifenesin Er] Itching   Patient stated that she can take the DM    Tobramycin -dexamethasone     Made eyes swell even worse        Medication List     TAKE these medications    Acetaminophen  Extra Strength 500 MG Tabs Take 2 tablets (1,000 mg total) by mouth every 6 (six) hours for 3 days.   albuterol  108 (90 Base) MCG/ACT inhaler Commonly known as: VENTOLIN  HFA Inhale into the lungs.   celecoxib  200 MG capsule Commonly known as: CELEBREX  Take 1  capsule (200 mg total) by mouth 2 (two) times daily for 3 days.   DULoxetine  60 MG capsule Commonly known as: CYMBALTA  Take 60 mg by mouth daily.   EPINEPHrine 0.3 mg/0.3 mL Soaj injection Commonly known as: EPI-PEN Inject 0.3 mg into the muscle as needed.   fluticasone 50 MCG/ACT nasal spray Commonly known as: FLONASE Place 1 spray into both nostrils daily.   gabapentin  300 MG capsule Commonly known as: NEURONTIN  Take 1 capsule (300 mg total) by mouth at bedtime for 14 days. Take nightly for three days after surgery, and then as needed for pain for 14 days.   levocetirizine 5 MG tablet Commonly known as: XYZAL Take  5 mg by mouth every evening.   meloxicam 15 MG tablet Commonly known as: MOBIC Take 15 mg by mouth daily.   montelukast  10 MG tablet Commonly known as: SINGULAIR  Take 10 mg by mouth daily.   ondansetron  4 MG disintegrating tablet Commonly known as: ZOFRAN -ODT Take 1 tablet (4 mg total) by mouth every 8 (eight) hours as needed for up to 7 days for nausea or vomiting.   oxyCODONE  5 MG immediate release tablet Commonly known as: Oxy IR/ROXICODONE  Take 1 tablet (5 mg total) by mouth every 4 (four) hours as needed for severe pain (pain score 7-10).   tiZANidine  2 MG tablet Commonly known as: ZANAFLEX  Take 2 mg by mouth every 8 (eight) hours as needed for muscle spasms.   tranexamic acid  650 MG Tabs tablet Commonly known as: LYSTEDA  Take 2 tablets (1,300 mg total) by mouth 3 (three) times daily. Take during menses for a maximum of five days until bleeding stops   VITAMIN B 12 PO Take by mouth.          Heather Penton, MD   LOS: 1 day   Heather Penton 04/12/2024, 9:18 AM
